# Patient Record
Sex: Female | Born: 1960 | Race: White | Hispanic: No | Smoking: Never smoker
Health system: Southern US, Community
[De-identification: ages and names within clinical notes are randomized; demographics above are authoritative.]

## PROBLEM LIST (undated history)

## (undated) DIAGNOSIS — N6002 Solitary cyst of left breast: Secondary | ICD-10-CM

## (undated) DIAGNOSIS — N6001 Solitary cyst of right breast: Secondary | ICD-10-CM

## (undated) HISTORY — PX: ANKLE FRACTURE SURGERY: SHX122

## (undated) HISTORY — PX: BREAST CYST ASPIRATION: SHX578

## (undated) HISTORY — PX: DENTAL SURGERY: SHX609

## (undated) HISTORY — PX: ABDOMINAL HYSTERECTOMY: SHX81

## (undated) HISTORY — PX: TUBAL LIGATION: SHX77

## (undated) HISTORY — PX: LEG SURGERY: SHX1003

## (undated) HISTORY — DX: Solitary cyst of right breast: N60.01

## (undated) HISTORY — DX: Solitary cyst of left breast: N60.02

---
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
## 1995-08-09 ENCOUNTER — Emergency Department: Admit: 1995-08-09 | Payer: Self-pay | Admitting: Emergency Medicine

## 1995-09-04 ENCOUNTER — Ambulatory Visit: Admit: 1995-09-04 | Disposition: A | Discharge status: Home / Self Care | Payer: Self-pay | Source: Ambulatory Visit

## 1998-12-09 DIAGNOSIS — I639 Cerebral infarction, unspecified: Secondary | ICD-10-CM

## 1998-12-09 HISTORY — DX: Cerebral infarction, unspecified: I63.9

## 1999-08-24 ENCOUNTER — Emergency Department: Admit: 1999-08-24 | Payer: Self-pay | Admitting: Emergency Medicine

## 2000-03-06 ENCOUNTER — Emergency Department: Admit: 2000-03-06 | Payer: Self-pay | Admitting: Emergency Medicine

## 2000-03-20 ENCOUNTER — Emergency Department: Admit: 2000-03-20 | Payer: Self-pay | Admitting: Emergency Medicine

## 2000-05-02 ENCOUNTER — Emergency Department: Admit: 2000-05-02 | Payer: Self-pay | Admitting: Emergency Medicine

## 2000-05-06 ENCOUNTER — Emergency Department: Admit: 2000-05-06 | Payer: Self-pay | Admitting: Emergency Medicine

## 2015-03-13 ENCOUNTER — Encounter (HOSPITAL_COMMUNITY): Payer: Self-pay

## 2015-03-13 ENCOUNTER — Emergency Department (HOSPITAL_COMMUNITY)
Admission: EM | Admit: 2015-03-13 | Discharge: 2015-03-13 | Disposition: A | Discharge status: Home / Self Care | Payer: Self-pay | Attending: Emergency Medicine | Admitting: Emergency Medicine

## 2015-03-13 ENCOUNTER — Emergency Department (HOSPITAL_COMMUNITY): Payer: Self-pay

## 2015-03-13 DIAGNOSIS — X58XXXA Exposure to other specified factors, initial encounter: Secondary | ICD-10-CM | POA: Insufficient documentation

## 2015-03-13 DIAGNOSIS — Y998 Other external cause status: Secondary | ICD-10-CM | POA: Insufficient documentation

## 2015-03-13 DIAGNOSIS — B372 Candidiasis of skin and nail: Secondary | ICD-10-CM

## 2015-03-13 DIAGNOSIS — R9389 Abnormal findings on diagnostic imaging of other specified body structures: Secondary | ICD-10-CM

## 2015-03-13 DIAGNOSIS — E669 Obesity, unspecified: Secondary | ICD-10-CM | POA: Insufficient documentation

## 2015-03-13 DIAGNOSIS — S80819A Abrasion, unspecified lower leg, initial encounter: Secondary | ICD-10-CM

## 2015-03-13 DIAGNOSIS — R079 Chest pain, unspecified: Secondary | ICD-10-CM

## 2015-03-13 DIAGNOSIS — R918 Other nonspecific abnormal finding of lung field: Secondary | ICD-10-CM | POA: Insufficient documentation

## 2015-03-13 DIAGNOSIS — Y9389 Activity, other specified: Secondary | ICD-10-CM | POA: Insufficient documentation

## 2015-03-13 DIAGNOSIS — L259 Unspecified contact dermatitis, unspecified cause: Secondary | ICD-10-CM | POA: Insufficient documentation

## 2015-03-13 DIAGNOSIS — Y9289 Other specified places as the place of occurrence of the external cause: Secondary | ICD-10-CM | POA: Insufficient documentation

## 2015-03-13 DIAGNOSIS — E041 Nontoxic single thyroid nodule: Secondary | ICD-10-CM

## 2015-03-13 LAB — URINALYSIS, ROUTINE W REFLEX MICROSCOPIC
Bilirubin Urine: NEGATIVE
GLUCOSE, UA: NEGATIVE mg/dL
HGB URINE DIPSTICK: NEGATIVE
KETONES UR: NEGATIVE mg/dL
Leukocytes, UA: NEGATIVE
Nitrite: NEGATIVE
PROTEIN: NEGATIVE mg/dL
Specific Gravity, Urine: 1.013 (ref 1.005–1.030)
Urobilinogen, UA: 0.2 mg/dL (ref 0.0–1.0)
pH: 5.5 (ref 5.0–8.0)

## 2015-03-13 LAB — I-STAT TROPONIN, ED: Troponin i, poc: 0 ng/mL (ref 0.00–0.08)

## 2015-03-13 LAB — BASIC METABOLIC PANEL
ANION GAP: 10 (ref 5–15)
BUN: 9 mg/dL (ref 6–23)
CALCIUM: 8.9 mg/dL (ref 8.4–10.5)
CO2: 27 mmol/L (ref 19–32)
Chloride: 103 mmol/L (ref 96–112)
Creatinine, Ser: 0.64 mg/dL (ref 0.50–1.10)
GLUCOSE: 99 mg/dL (ref 70–99)
POTASSIUM: 4.3 mmol/L (ref 3.5–5.1)
SODIUM: 140 mmol/L (ref 135–145)

## 2015-03-13 LAB — CBC WITH DIFFERENTIAL/PLATELET
BASOS ABS: 0 10*3/uL (ref 0.0–0.1)
Basophils Relative: 0 % (ref 0–1)
EOS ABS: 0.2 10*3/uL (ref 0.0–0.7)
EOS PCT: 2 % (ref 0–5)
HEMATOCRIT: 44.5 % (ref 36.0–46.0)
Hemoglobin: 14 g/dL (ref 12.0–15.0)
LYMPHS PCT: 34 % (ref 12–46)
Lymphs Abs: 3.3 10*3/uL (ref 0.7–4.0)
MCH: 31.9 pg (ref 26.0–34.0)
MCHC: 31.5 g/dL (ref 30.0–36.0)
MCV: 101.4 fL — AB (ref 78.0–100.0)
MONO ABS: 0.8 10*3/uL (ref 0.1–1.0)
Monocytes Relative: 9 % (ref 3–12)
Neutro Abs: 5.3 10*3/uL (ref 1.7–7.7)
Neutrophils Relative %: 55 % (ref 43–77)
Platelets: 214 10*3/uL (ref 150–400)
RBC: 4.39 MIL/uL (ref 3.87–5.11)
RDW: 12.9 % (ref 11.5–15.5)
WBC: 9.5 10*3/uL (ref 4.0–10.5)

## 2015-03-13 LAB — TSH: TSH: 1.29 u[IU]/mL (ref 0.350–4.500)

## 2015-03-13 LAB — D-DIMER, QUANTITATIVE: D-Dimer, Quant: 0.28 ug/mL-FEU (ref 0.00–0.48)

## 2015-03-13 MED ORDER — CEPHALEXIN 500 MG PO CAPS: 500.0000 mg | ORAL_CAPSULE | Freq: Two times a day (BID) | ORAL | Status: AC

## 2015-03-13 MED ORDER — ACETAMINOPHEN 325 MG PO TABS
650.0000 mg | ORAL_TABLET | Freq: Once | ORAL | Status: AC
  Administered 2015-03-13: 650 mg via ORAL
  Filled 2015-03-13: qty 2

## 2015-03-13 MED ORDER — BACITRACIN 500 UNIT/GM EX OINT
4.0000 "application " | TOPICAL_OINTMENT | Freq: Once | CUTANEOUS | Status: AC
  Administered 2015-03-13: 4 via TOPICAL
  Filled 2015-03-13: qty 3.6

## 2015-03-13 MED ORDER — SODIUM CHLORIDE 0.9 % IV BOLUS (SEPSIS)
1000.0000 mL | Freq: Once | INTRAVENOUS | Status: AC
  Administered 2015-03-13: 1000 mL via INTRAVENOUS

## 2015-03-13 MED ORDER — CLOTRIMAZOLE 1 % EX CREA: TOPICAL_CREAM | CUTANEOUS | Status: AC

## 2015-03-13 MED ORDER — IOHEXOL 300 MG/ML  SOLN
80.0000 mL | Freq: Once | INTRAMUSCULAR | Status: AC | PRN
  Administered 2015-03-13: 80 mL via INTRAVENOUS

## 2015-03-13 NOTE — ED Provider Notes (Signed)
CSN: [id number]     Arrival date & time 03/13/15  1313 History   First MD Initiated Contact with Patient 03/13/15 1507     Chief Complaint  Patient presents with  . Weakness  . Leg sores   . Nasal Congestion     (Consider location/radiation/quality/duration/timing/severity/associated sxs/prior Treatment) HPI   Pam Baxter is a 54 y.o. female brought in by EMS for evaluation of lower leg sores. Patient states that the sores popped up 2 weeks ago, she is scratching at them. She denies fevers, chills, spreading redness, streaking up the legs, pain, nausea vomiting, calf pain or leg swelling, history of DVT or PE. She states that she has a generalized weakness, states that she is lightheaded when going from sitting to standing. She has a left-sided chest pain which is intermittent, she cannot characterize the pain, she states that it comes in waves, it is 5 out of 10 at worst, she taken no pain medication. She does not take a daily aspirin. She states she feels slightly more short of breath and normal. She feels that she has a postnasal drip from a deviated septum associated with nasal congestion that is making her cough, sometimes the sputum is blood streaked. She also notes that she has a rash underneath the bilateral breasts which is pruritic.  History reviewed. No pertinent past medical history. Past Surgical History  Procedure Laterality Date  . Ankle fracture surgery    . Abdominal hysterectomy    . Tubal ligation    . Dental surgery    . Breast cyst aspiration     History reviewed. No pertinent family history. History  Substance Use Topics  . Smoking status: Never Smoker   . Smokeless tobacco: Not on file  . Alcohol Use: No   OB History    No data available     Review of Systems  10 systems reviewed and found to be negative, except as noted in the HPI.   Allergies  Review of patient's allergies indicates no known allergies.  Home Medications   Prior to Admission  medications   Medication Sig Start Date End Date Taking? Authorizing Provider  cephALEXin (KEFLEX) 500 MG capsule Take 1 capsule (500 mg total) by mouth 2 (two) times daily. 03/13/15   Jadynn Epping, PA-C  clotrimazole (LOTRIMIN) 1 % cream Apply to affected area 2 times daily 03/13/15   Joni Reining Ezra Marquess, PA-C   BP 132/99 mmHg  Pulse 88  Temp(Src) 98.2 F (36.8 C) (Oral)  Resp 18  SpO2 100% Physical Exam  Constitutional: She is oriented to person, place, and time.  Obese  HENT:  Poor dentition, posterior pharynx is injected, uvula is midline, soft palate rises symmetrically, no tonsillar hypertrophy or exudate, no tenderness to palpation of the maxillary or frontal sinuses. Mucosa in the nares is moderately edematous bilaterally.  Eyes: Conjunctivae are normal. Pupils are equal, round, and reactive to light.  Neck: Normal range of motion. Neck supple.  Cardiovascular: Normal rate, regular rhythm, normal heart sounds and intact distal pulses.   Pulmonary/Chest: Effort normal and breath sounds normal. No respiratory distress. She has no wheezes. She has no rales. She exhibits no tenderness.  Abdominal: Soft.  Neurological: She is alert and oriented to person, place, and time.  Skin: Rash noted.  Patient has mildly excoriated and bloody scabs to the bilateral lower extremities, there is no tenderness to palpation, warmth, induration, purulent discharge.   Patient has slightly raised, erythematous rash underneath the bilateral breasts, there is  no satellite lesions, no tenderness, warmth or discharge.    ED Course  Procedures (including critical care time) Labs Review Labs Reviewed  CBC WITH DIFFERENTIAL/PLATELET - Abnormal; Notable for the following:    MCV 101.4 (*)    All other components within normal limits  BASIC METABOLIC PANEL  URINALYSIS, ROUTINE W REFLEX MICROSCOPIC  D-DIMER, QUANTITATIVE  TSH  T4, FREE  I-STAT TROPOININ, ED    Imaging Review Dg Chest 2  View  03/13/2015   CLINICAL DATA:  Shortness of breath and left-sided chest pain  EXAM: CHEST  2 VIEW  COMPARISON:  None.  FINDINGS: Cardiac shadow is within normal limits. Rounded density is noted adjacent to the cardiac apex suspicious for an underlying mass lesion. CT of the chest is recommended for further evaluation. No focal infiltrate or sizable effusion is seen. Degenerative changes of the thoracic spine are noted.  IMPRESSION: Changes suspicious for left basilar mass. CT of the chest is recommended for further evaluation.   Electronically Signed   By: Alcide CleverMark  Lukens M.D.   On: 03/13/2015 16:58   Ct Chest W Contrast  03/13/2015   CLINICAL DATA:  Persistent cough.  Abnormal chest x-ray.  EXAM: CT CHEST WITH CONTRAST  TECHNIQUE: Multidetector CT imaging of the chest was performed during intravenous contrast administration.  CONTRAST:  80mL OMNIPAQUE IOHEXOL 300 MG/ML  SOLN  COMPARISON:  Chest radiograph of same day.  FINDINGS: No pneumothorax or pleural effusion is noted. No acute pulmonary disease is noted. No pulmonary nodule or mass is noted. There is no evidence of mediastinal mass or adenopathy. 2.2 cm nodule appears to be arising from the inferior portion of the left thyroid lobe; thyroid ultrasound is recommended for further evaluation. Thoracic aorta appears normal. Visualized portion of upper abdomen appears normal. No significant osseous abnormality is noted.  IMPRESSION: No evidence of pulmonary mass or nodule. No acute cardiopulmonary abnormality seen.  2.2 cm nodule arising from inferior portion of left thyroid lobe; thyroid ultrasound is recommended for further evaluation.   Electronically Signed   By: Lupita RaiderJames  Green Jr, M.D.   On: 03/13/2015 18:16     EKG Interpretation None      MDM   Final diagnoses:  Chest pain  Candidal dermatitis  Excoriation of lower leg, unspecified laterality, initial encounter  Abnormal CXR (chest x-ray)  Thyroid nodule    Filed Vitals:   03/13/15 1332  03/13/15 1634  BP: 133/86 132/99  Pulse: 89 88  Temp: 98 F (36.7 C) 98.2 F (36.8 C)  TempSrc: Oral Oral  Resp: 18 18  SpO2: 100% 100%    Medications  sodium chloride 0.9 % bolus 1,000 mL (1,000 mLs Intravenous New Bag/Given 03/13/15 1549)  bacitracin ointment 4 application (4 application Topical Given 03/13/15 1557)  acetaminophen (TYLENOL) tablet 650 mg (650 mg Oral Given 03/13/15 1610)  iohexol (OMNIPAQUE) 300 MG/ML solution 80 mL (80 mLs Intravenous Contrast Given 03/13/15 1744)    Johnette AbrahamCourtney Makepeace is a pleasant 54 y.o. female presenting with generalized weakness, sores to bilateral legs. He reports that she is coughing up blood as well. Physical exam is not consistent with DVT. Her dimer is negative. Chest x-ray shows possible mass.   CT shows no pulmonary mass or nodule however there is a 2.2 cm nodule arising from the inferior portion of the left thyroid. I will draw TSH and free T4, and explained to her that she will have to have this followed up as an outpatient. Patient will be given referral  to the wellness Center. I will start her on Keflex prophylactically for her lower extremity excoriations. Patient will be advised topical Lotrimin for her Candida underneath the bilateral breasts.  EKG is not crossing over into the AVM are. It shows normal sinus rhythm at 76 bpm with a left anterior fascicular block. Troponin is negative.  Evaluation does not show pathology that would require ongoing emergent intervention or inpatient treatment. Pt is hemodynamically stable and mentating appropriately. Discussed findings and plan with patient/guardian, who agrees with care plan. All questions answered. Return precautions discussed and outpatient follow up given.   New Prescriptions   CEPHALEXIN (KEFLEX) 500 MG CAPSULE    Take 1 capsule (500 mg total) by mouth 2 (two) times daily.   CLOTRIMAZOLE (LOTRIMIN) 1 % CREAM    Apply to affected area 2 times daily         Wynetta Emery,  PA-C 03/13/15 1908  Rolland Porter, MD 03/19/15 [phone]

## 2015-03-13 NOTE — Discharge Instructions (Signed)
Your workup today shows that you have a nodule on your thyroid. I have drawn some blood work today to further evaluate that, but he may need an ultrasound. Please follow with the wellness Center for follow-up on this blood work and to help you obtain your ultrasound.  Wash the affected area with soap and water and apply a thin layer of topical antibiotic ointment. Do this every 12 hours.   Do not use rubbing alcohol or hydrogen peroxide.                        Look for signs of infection: if you see redness, if the area becomes warm, if pain increases sharply, there is discharge (pus), if red streaks appear or you develop fever or vomiting, RETURN immediately to the Emergency Department  for a recheck.   Don't scratch at the areas on your legs. You can take Benadryl or apply topical hydrocortisone ointment when you feel the need to scratch.  Do not hesitate to return to the Emergency Department for any new, worsening or concerning symptoms.   If you do not have a primary care doctor you can establish one at the   [facility]: [address] [place] [address] [phone]  After you establish care. Let them know you were seen in the emergency room. They must obtain records for further management.

## 2015-03-13 NOTE — ED Notes (Signed)
Patient transported to CT 

## 2015-03-13 NOTE — ED Notes (Addendum)
Pt c/o increasing weakness, generalized pain, leg sores "just popping up," and nasal congestion x unknown amount of time.  Pain score 5/10.  Denies n/v/d.  Pt reports that she only came in because the facility manager called EMS.  [facility]

## 2015-03-13 NOTE — Progress Notes (Addendum)
EMS called to pt's apartment by house manager because pt does not leave her room. Pt c/o leg sores, cough, sinus problems, breast pain, armpit pain, hot flashes, and fatigue. Pt states she has not been to see a doctor since 2010. Pt [place]  Reports she is widow of a "cop" Confirms she has dental coverage not medical. [facility] [place] [phone] [address], [place], [place] [address]      CM spoke with pt who [place]cp. CM discussed and provided written information for self pay pcps, importance of pcp for f/u care, www.needymeds.org, www.goodrx.com, discounted pharmacies and other [place] resources such as [facility], [facility], affordable care act,  financial assistance, DSS and  health department  Reviewed resources for [place] self pay pcps like [facility], family medicine at [address], [facility] family [facility], general medical clinics, [facility] urgent care plus others, medication resources, [facility] out patient pharmacies and housing Pt voiced understanding and appreciation of resources provided   Provided [facility] contact information but pt is not eligible until she has been in [place] for 3-6 months   Provided pt with a 5 page list of [facility] providers within 15 miles from zip [phone]

## 2015-03-13 NOTE — ED Notes (Signed)
Per EMS, EMS called to pt's apartment by house manager because pt does not leave her room. Pt c/o leg sores, cough, sinus problems, breast pain, armpit pain, hot flashes, and fatigue. Pt states she has not been to see a doctor since 2010.

## 2015-03-14 LAB — T4, FREE: FREE T4: 1.1 ng/dL (ref 0.80–1.80)

## 2016-03-26 IMAGING — CR DG CHEST 2V
2 series · 2 of 2 positions shown · non-contrast
Comparison: None.

CLINICAL DATA: Shortness of breath and left-sided chest pain

EXAM:
CHEST  2 VIEW

[w chest pa]
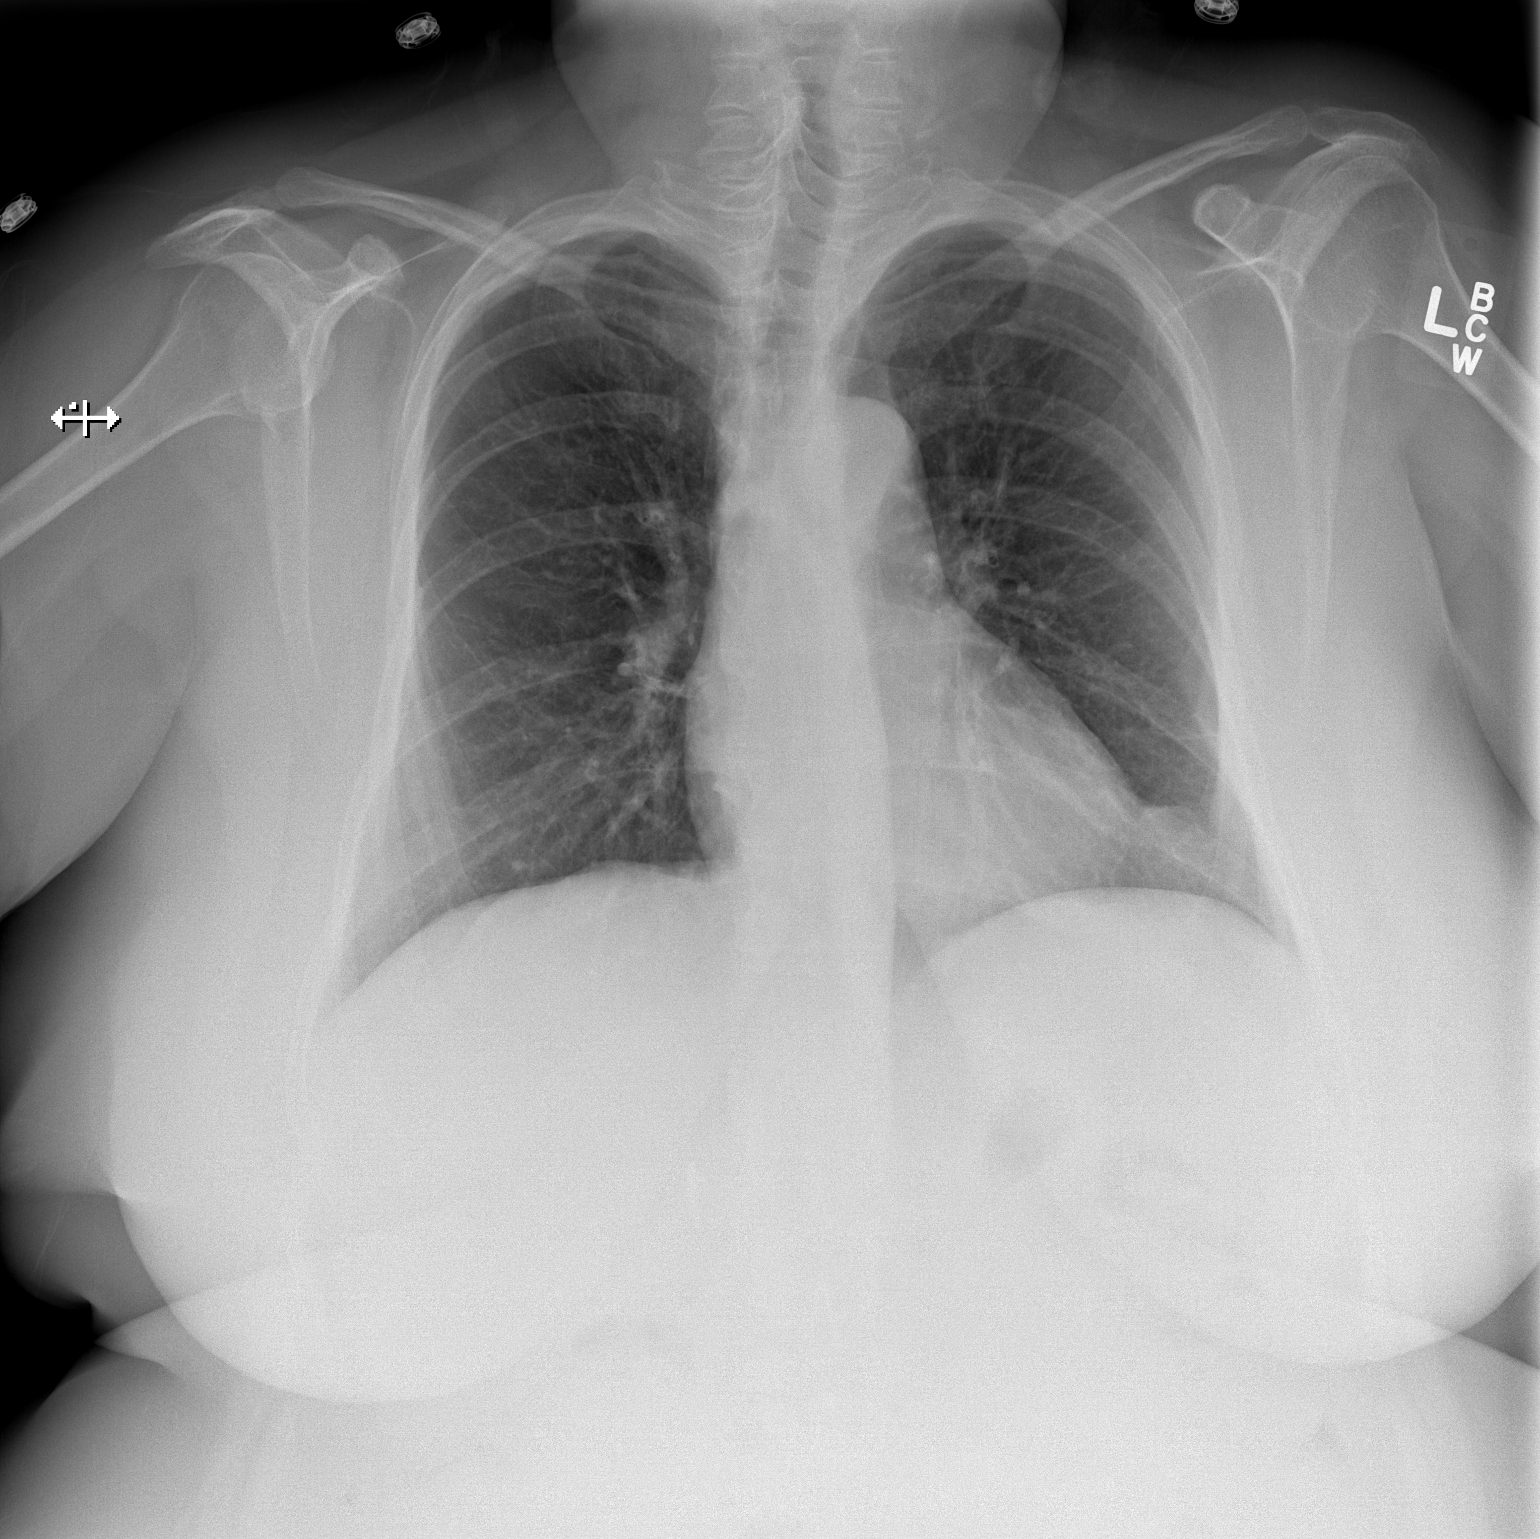

[w chest lat]
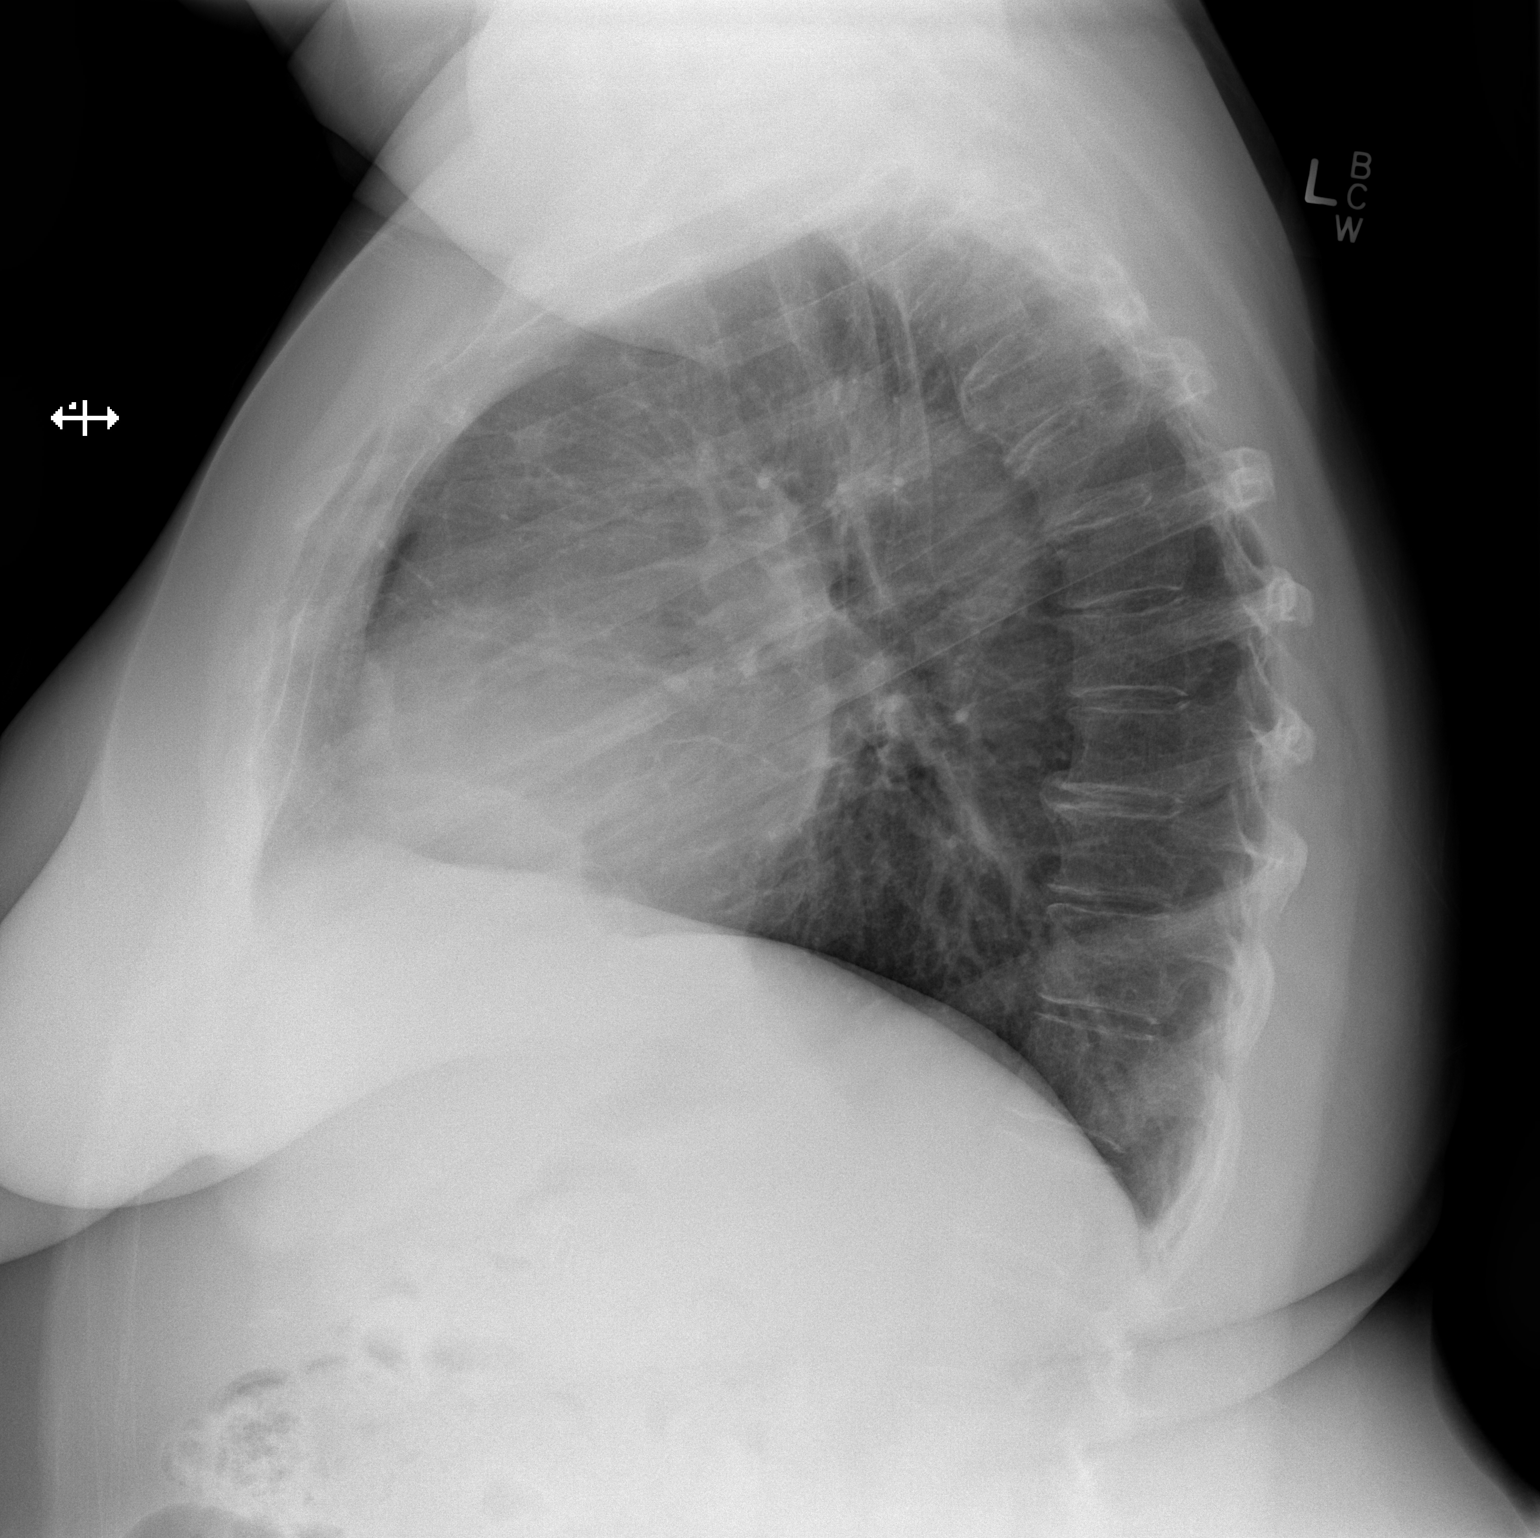

[2 of 2 positions shown; findings below may reference images not displayed]

FINDINGS: Cardiac shadow is within normal limits. Rounded density is noted
adjacent to the cardiac apex suspicious for an underlying mass
lesion. CT of the chest is recommended for further evaluation. No
focal infiltrate or sizable effusion is seen. Degenerative changes
of the thoracic spine are noted.
IMPRESSION: Changes suspicious for left basilar mass. CT of the chest is
recommended for further evaluation.

## 2016-03-26 IMAGING — CT CT CHEST W/ CM
1 series · 16 of 31 positions shown, 20 images · IV contrast (OMNIPAQUE 300)
Comparison: Chest radiograph of same day.

CLINICAL DATA: Persistent cough.  Abnormal chest x-ray.

EXAM:
CT CHEST WITH CONTRAST
TECHNIQUE: Multidetector CT imaging of the chest was performed during
intravenous contrast administration.
CONTRAST:  80mL OMNIPAQUE IOHEXOL 300 MG/ML  SOLN

[Series 2: chest with st · axial · 0.73mm/px · z∈[+1441,+1676]mm · 16 of 51 slices shown, 20 images]
[im 2/51  mediastinal]
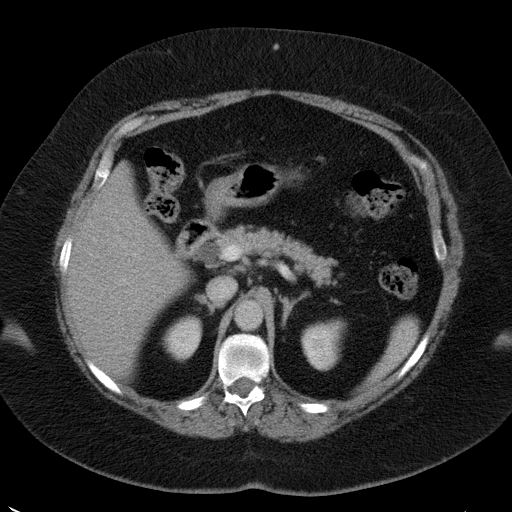
[im 2/51  lung]
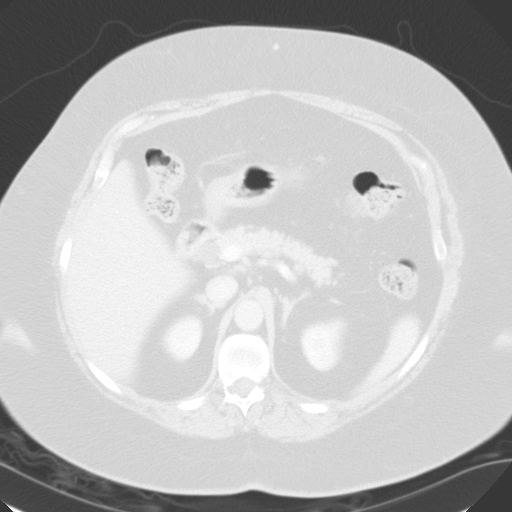
[im 6/51  lung]
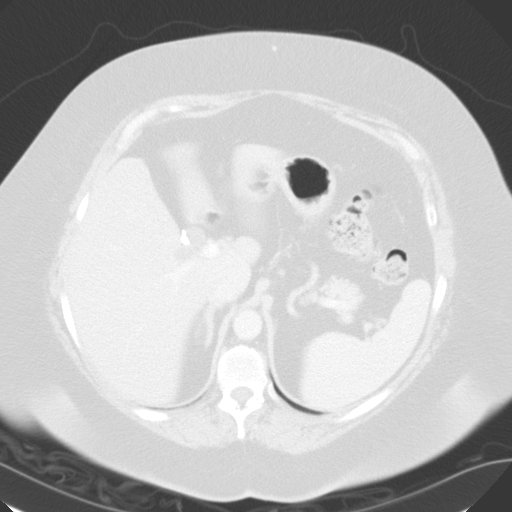
[im 10/51  lung]
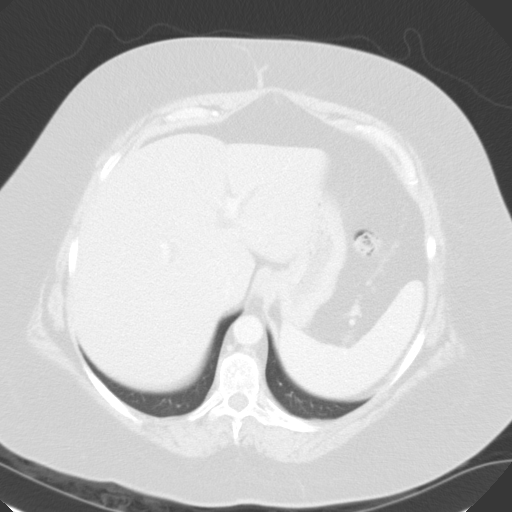
[im 12/51  lung]
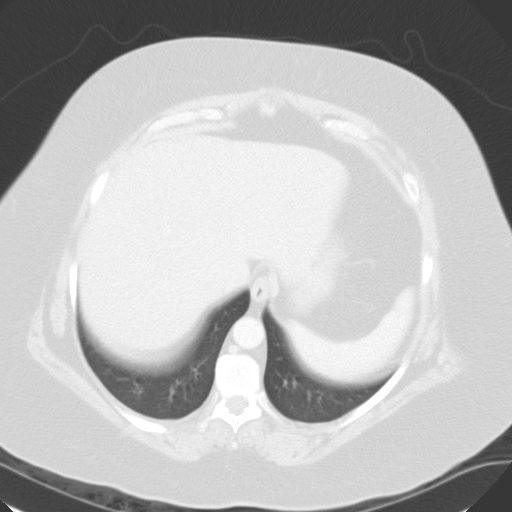
[im 15/51  mediastinal]
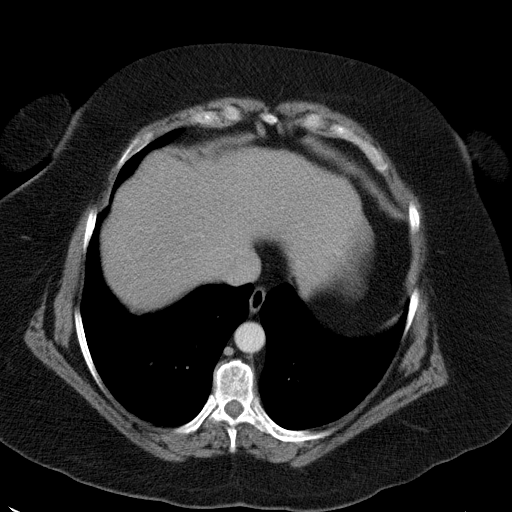
[im 15/51  lung]
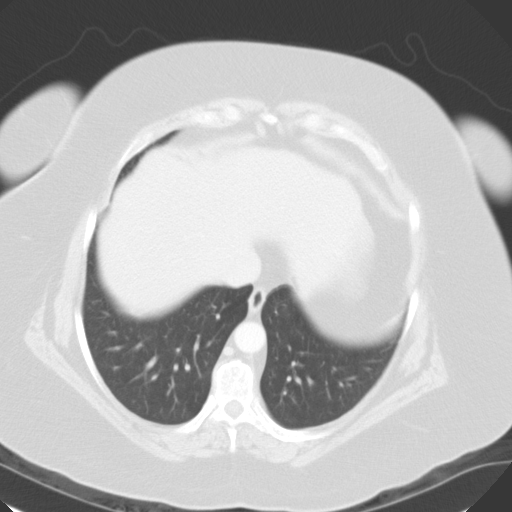
[im 17/51  lung]
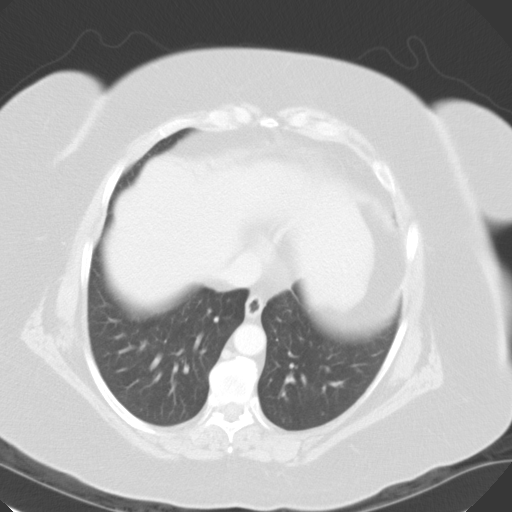
[im 21/51  lung]
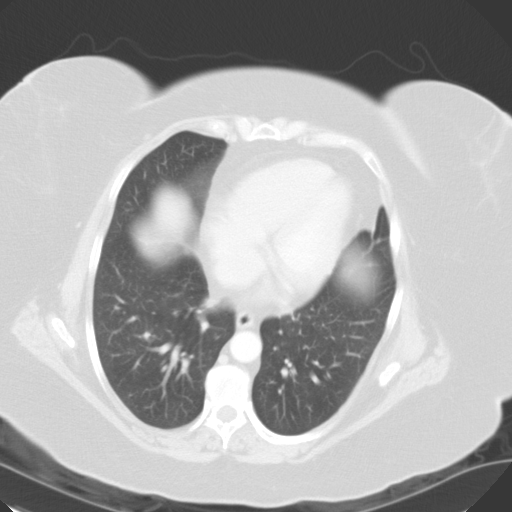
[im 24/51  lung]
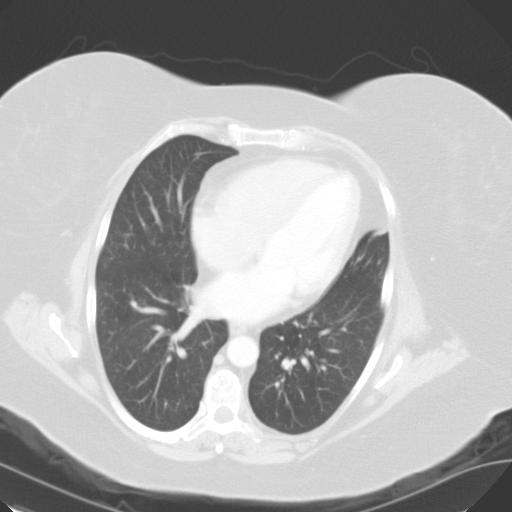
[im 26/51  mediastinal]
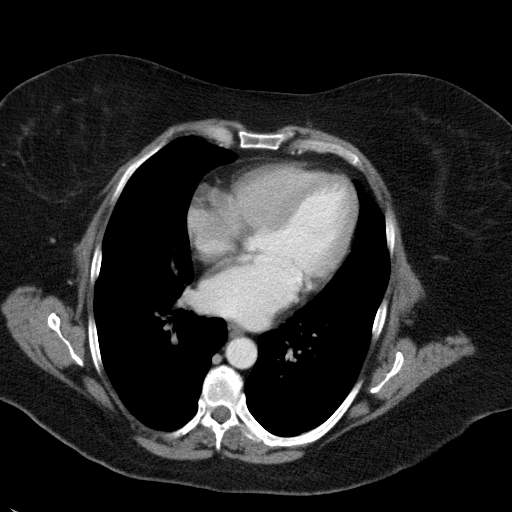
[im 26/51  lung]
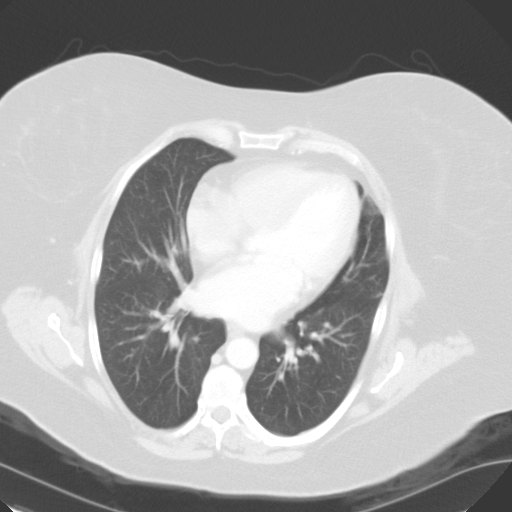
[im 30/51  lung]
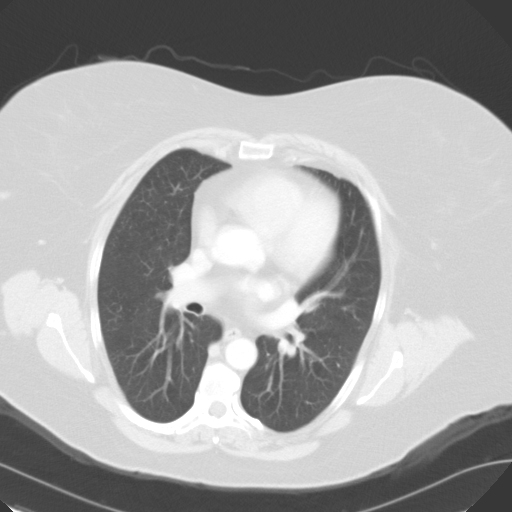
[im 32/51  lung]
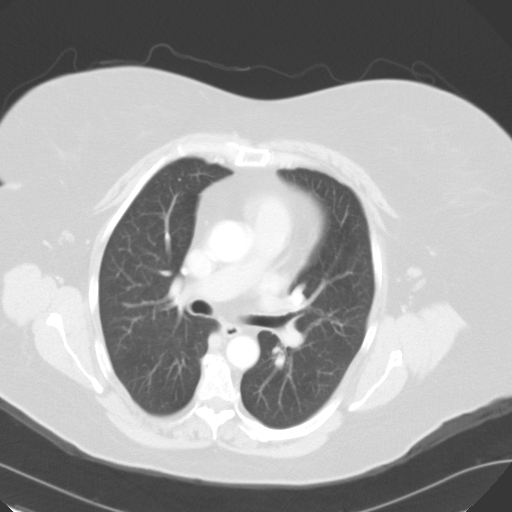
[im 34/51  lung]
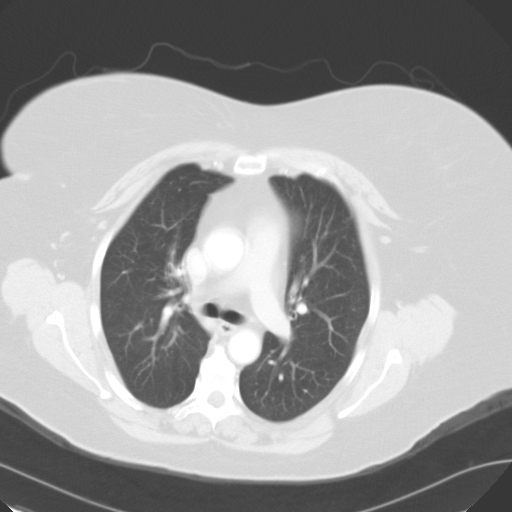
[im 38/51  mediastinal]
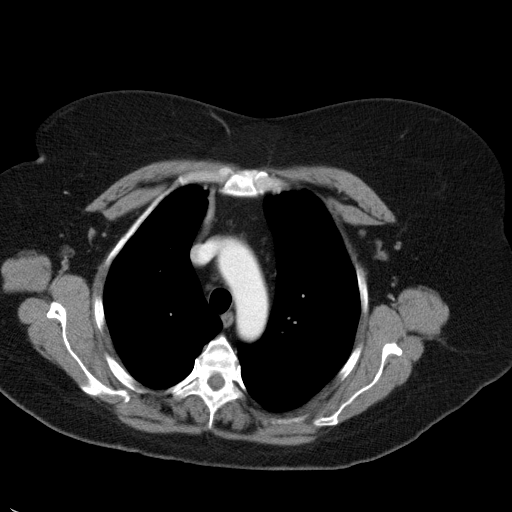
[im 38/51  lung]
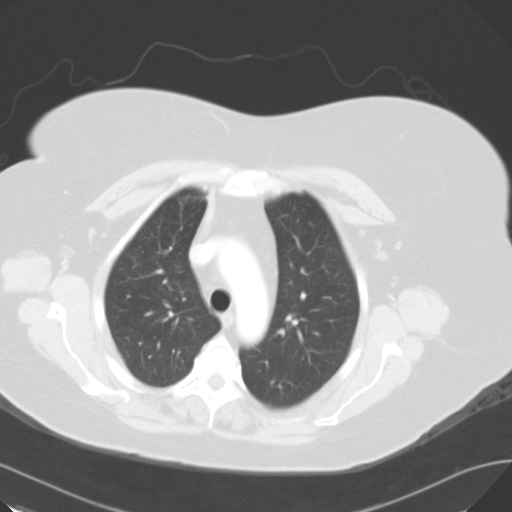
[im 41/51  lung]
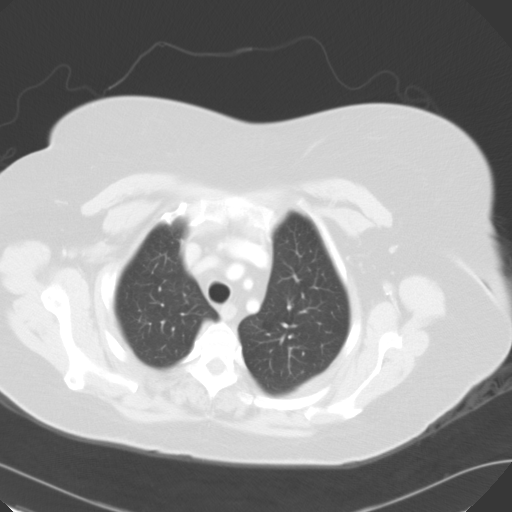
[im 45/51  lung]
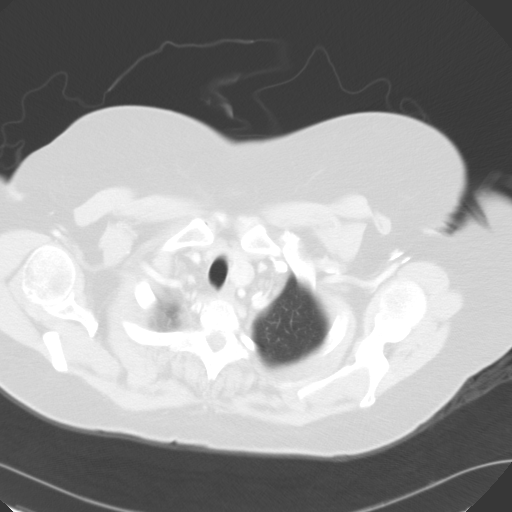
[im 49/51  lung]
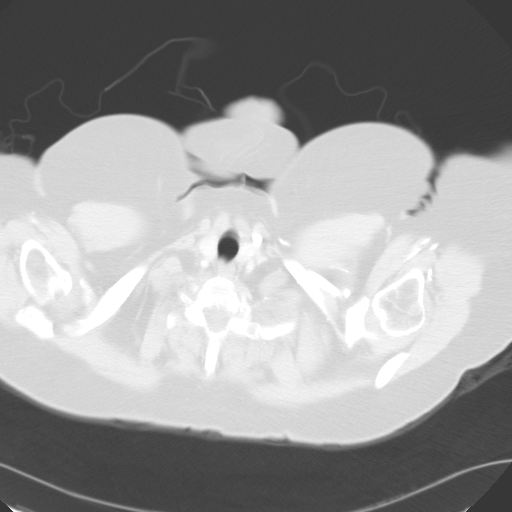

[16 of 31 positions shown; findings below may reference images not displayed]

FINDINGS: No pneumothorax or pleural effusion is noted. No acute pulmonary
disease is noted. No pulmonary nodule or mass is noted. There is no
evidence of mediastinal mass or adenopathy. 2.2 cm nodule appears to
be arising from the inferior portion of the left thyroid lobe;
thyroid ultrasound is recommended for further evaluation. Thoracic
aorta appears normal. Visualized portion of upper abdomen appears
normal. No significant osseous abnormality is noted.
IMPRESSION: No evidence of pulmonary mass or nodule. No acute cardiopulmonary
abnormality seen.

2.2 cm nodule arising from inferior portion of left thyroid lobe;
thyroid ultrasound is recommended for further evaluation.

## 2020-07-07 ENCOUNTER — Emergency Department: Payer: 344

## 2020-07-07 ENCOUNTER — Emergency Department
Admission: EM | Admit: 2020-07-07 | Discharge: 2020-07-07 | Disposition: A | Discharge status: Home / Self Care | Payer: 344 | Attending: Emergency Medicine | Admitting: Emergency Medicine

## 2020-07-07 DIAGNOSIS — E042 Nontoxic multinodular goiter: Secondary | ICD-10-CM | POA: Insufficient documentation

## 2020-07-07 DIAGNOSIS — K839 Disease of biliary tract, unspecified: Secondary | ICD-10-CM

## 2020-07-07 DIAGNOSIS — R140 Abdominal distension (gaseous): Secondary | ICD-10-CM | POA: Insufficient documentation

## 2020-07-07 DIAGNOSIS — E041 Nontoxic single thyroid nodule: Secondary | ICD-10-CM

## 2020-07-07 DIAGNOSIS — Z9049 Acquired absence of other specified parts of digestive tract: Secondary | ICD-10-CM | POA: Insufficient documentation

## 2020-07-07 DIAGNOSIS — K838 Other specified diseases of biliary tract: Secondary | ICD-10-CM | POA: Insufficient documentation

## 2020-07-07 DIAGNOSIS — R1084 Generalized abdominal pain: Secondary | ICD-10-CM | POA: Insufficient documentation

## 2020-07-07 DIAGNOSIS — R079 Chest pain, unspecified: Secondary | ICD-10-CM | POA: Insufficient documentation

## 2020-07-07 DIAGNOSIS — Z20822 Contact with and (suspected) exposure to covid-19: Secondary | ICD-10-CM | POA: Insufficient documentation

## 2020-07-07 DIAGNOSIS — R110 Nausea: Secondary | ICD-10-CM | POA: Insufficient documentation

## 2020-07-07 DIAGNOSIS — G479 Sleep disorder, unspecified: Secondary | ICD-10-CM | POA: Insufficient documentation

## 2020-07-07 LAB — CBC AND DIFFERENTIAL
Absolute NRBC: 0 10*3/uL (ref 0.00–0.00)
Basophils Absolute Automated: 0.06 10*3/uL (ref 0.00–0.08)
Basophils Automated: 0.7 %
Eosinophils Absolute Automated: 0.12 10*3/uL (ref 0.00–0.44)
Eosinophils Automated: 1.5 %
Hematocrit: 44.6 % — ABNORMAL HIGH (ref 34.7–43.7)
Hgb: 14.5 g/dL (ref 11.4–14.8)
Immature Granulocytes Absolute: 0.03 10*3/uL (ref 0.00–0.07)
Immature Granulocytes: 0.4 %
Lymphocytes Absolute Automated: 2.65 10*3/uL (ref 0.42–3.22)
Lymphocytes Automated: 32.8 %
MCH: 32.6 pg (ref 25.1–33.5)
MCHC: 32.5 g/dL (ref 31.5–35.8)
MCV: 100.2 fL — ABNORMAL HIGH (ref 78.0–96.0)
MPV: 10.1 fL (ref 8.9–12.5)
Monocytes Absolute Automated: 0.71 10*3/uL (ref 0.21–0.85)
Monocytes: 8.8 %
Neutrophils Absolute: 4.52 10*3/uL (ref 1.10–6.33)
Neutrophils: 55.8 %
Nucleated RBC: 0 /100 WBC (ref 0.0–0.0)
Platelets: 203 10*3/uL (ref 142–346)
RBC: 4.45 10*6/uL (ref 3.90–5.10)
RDW: 13 % (ref 11–15)
WBC: 8.09 10*3/uL (ref 3.10–9.50)

## 2020-07-07 LAB — COVID-19 (SARS-COV-2) & INFLUENZA  A/B, NAA (ROCHE LIAT)
Influenza A: NOT DETECTED
Influenza B: NOT DETECTED
SARS CoV 2 Overall Result: NOT DETECTED

## 2020-07-07 LAB — COMPREHENSIVE METABOLIC PANEL
ALT: 27 U/L (ref 0–55)
AST (SGOT): 30 U/L (ref 5–34)
Albumin/Globulin Ratio: 0.9 (ref 0.9–2.2)
Albumin: 3.6 g/dL (ref 3.5–5.0)
Alkaline Phosphatase: 101 U/L (ref 37–106)
Anion Gap: 12 (ref 5.0–15.0)
BUN: 16 mg/dL (ref 7–19)
Bilirubin, Total: 0.3 mg/dL (ref 0.2–1.2)
CO2: 24 mEq/L (ref 22–29)
Calcium: 9.6 mg/dL (ref 8.5–10.5)
Chloride: 106 mEq/L (ref 100–111)
Creatinine: 0.8 mg/dL (ref 0.6–1.0)
Globulin: 3.9 g/dL — ABNORMAL HIGH (ref 2.0–3.6)
Glucose: 133 mg/dL — ABNORMAL HIGH (ref 70–100)
Potassium: 4.9 mEq/L (ref 3.5–5.1)
Protein, Total: 7.5 g/dL (ref 6.0–8.3)
Sodium: 142 mEq/L (ref 136–145)

## 2020-07-07 LAB — URINALYSIS REFLEX TO MICROSCOPIC EXAM - REFLEX TO CULTURE
Bilirubin, UA: NEGATIVE
Glucose, UA: NEGATIVE
Ketones UA: NEGATIVE
Leukocyte Esterase, UA: NEGATIVE
Nitrite, UA: NEGATIVE
Protein, UR: 30 — AB
Specific Gravity UA: 1.026 (ref 1.001–1.035)
Urine pH: 5 (ref 5.0–8.0)
Urobilinogen, UA: 2 mg/dL (ref 0.2–2.0)

## 2020-07-07 LAB — TROPONIN I: Troponin I: <0.01 ng/mL (ref 0.00–0.05)

## 2020-07-07 LAB — RAPID DRUG SCREEN, URINE
Barbiturate Screen, UR: NEGATIVE
Benzodiazepine Screen, UR: NEGATIVE
Cannabinoid Screen, UR: NEGATIVE
Cocaine, UR: NEGATIVE
Opiate Screen, UR: NEGATIVE
PCP Screen, UR: NEGATIVE
Urine Amphetamine Screen: NEGATIVE

## 2020-07-07 LAB — GFR: EGFR: >60

## 2020-07-07 LAB — LIPASE: Lipase: 27 U/L (ref 8–78)

## 2020-07-07 LAB — TSH: TSH: 1.04 u[IU]/mL (ref 0.35–4.94)

## 2020-07-07 MED ORDER — GADOBUTROL 1 MMOL/ML IV SOLN
10.0000 mL | Freq: Once | INTRAVENOUS | Status: AC | PRN
Start: 2020-07-07 — End: 2020-07-07
  Administered 2020-07-07: 18:00:00 10 mmol via INTRAVENOUS

## 2020-07-07 MED ORDER — LORAZEPAM 2 MG/ML IJ SOLN
1.0000 mg | Freq: Once | INTRAMUSCULAR | Status: DC
Start: 2020-07-07 — End: 2020-07-07
  Filled 2020-07-07: qty 1

## 2020-07-07 MED ORDER — IOHEXOL 350 MG/ML IV SOLN
100.0000 mL | Freq: Once | INTRAVENOUS | Status: AC | PRN
Start: 2020-07-07 — End: 2020-07-07
  Administered 2020-07-07: 15:00:00 100 mL via INTRAVENOUS

## 2020-07-07 NOTE — ED Provider Notes (Signed)
EMERGENCY DEPARTMENT NOTE     Patient initially seen and examined at   ED PHYSICIAN ASSIGNED     None         ED MIDLEVEL (APP) ASSIGNED     None          HISTORY OF PRESENT ILLNESS   Historian:Patient  Translator Used: No    Chief Complaint: Chest Pain, Abdominal Pain, and Generalized weakness     Mechanism of Injury:       59 y.o. female presents to the ed today for diffuse and generalized symptoms. Pt is a poor historian and unsure what procedures she has had done in the past. She does know she has been told she has thyroid nodules, but takes no medications. Pt states she has been having difficulty sleeping, chest and abd pain generalized for the last several months. Pt c/o of intermittent nausea, chills, malaise, right shoulder pain.     1. Location of symptoms: diffuse  2. Onset of symptoms: at least 1 month  3. What was patient doing when symptoms started (Context): see above  4. Severity: moderate  5. Timing: intermittent  6. Activities that worsen symptoms: unknown  7. Activities that improve symptoms: unknown  8. Quality: nausea, generalized pain  9. Radiation of symptoms: no  10. Associated signs and Symptoms: see above  11. Are symptoms worsening? yes  MEDICAL HISTORY     Past Medical History:  No past medical history on file.    Past Surgical History:  No past surgical history on file.    Social History:  Social History     Socioeconomic History    Marital status: Widowed     Spouse name: Not on file    Number of children: Not on file    Years of education: Not on file    Highest education level: Not on file   Occupational History    Not on file   Tobacco Use    Smoking status: Not on file   Substance and Sexual Activity    Alcohol use: Not on file    Drug use: Not on file    Sexual activity: Not on file   Other Topics Concern    Not on file   Social History Narrative    Not on file     Social Determinants of Health     Financial Resource Strain:     Difficulty of Paying Living Expenses:     Food Insecurity:     Worried About Running Out of Food in the Last Year:     Ran Out of Food in the Last Year:    Transportation Needs:     Freight forwarder (Medical):     Lack of Transportation (Non-Medical):    Physical Activity:     Days of Exercise per Week:     Minutes of Exercise per Session:    Stress:     Feeling of Stress :    Social Connections:     Frequency of Communication with Friends and Family:     Frequency of Social Gatherings with Friends and Family:     Attends Religious Services:     Active Member of Clubs or Organizations:     Attends Banker Meetings:     Marital Status:    Intimate Partner Violence:     Fear of Current or Ex-Partner:     Emotionally Abused:     Physically Abused:     Sexually Abused:  Family History:  No family history on file.    Outpatient Medication:  Previous Medications    No medications on file         REVIEW OF SYSTEMS   Review of Systems   Constitutional: Positive for activity change, appetite change, chills and fatigue. Negative for diaphoresis and fever.   Respiratory: Negative for cough, chest tightness, shortness of breath and wheezing.    Cardiovascular: Positive for chest pain. Negative for palpitations.   Gastrointestinal: Positive for abdominal distention, abdominal pain and nausea. Negative for constipation, diarrhea and vomiting.   Genitourinary: Negative for dysuria.   Neurological: Negative for dizziness, seizures, weakness and headaches.   All other systems reviewed and are negative.             PHYSICAL EXAM     ED Triage Vitals [07/07/20 1206]   Enc Vitals Group      BP 114/82      Heart Rate 92      Resp Rate 22      Temp 99 F (37.2 C)      Temp Source Oral      SpO2 96 %      Weight 81.6 kg      Height 1.651 m      Head Circumference       Peak Flow       Pain Score 0      Pain Loc       Pain Edu?       Excl. in GC?      Physical Exam  Vitals and nursing note reviewed.   Constitutional:       Appearance:  She is well-developed. She is obese.   HENT:      Head: Normocephalic.   Eyes:      Pupils: Pupils are equal, round, and reactive to light.   Cardiovascular:      Rate and Rhythm: Normal rate and regular rhythm.      Heart sounds: Normal heart sounds.   Pulmonary:      Effort: Pulmonary effort is normal.      Breath sounds: Normal breath sounds.   Chest:      Chest wall: Tenderness present. No mass or edema.   Abdominal:      General: Bowel sounds are normal.      Palpations: Abdomen is soft.      Tenderness: There is abdominal tenderness.   Musculoskeletal:         General: Normal range of motion.      Cervical back: Normal range of motion.   Skin:     General: Skin is warm.      Capillary Refill: Capillary refill takes less than 2 seconds.      Coloration: Skin is pale.   Neurological:      General: No focal deficit present.      Mental Status: She is alert and oriented to person, place, and time.   Psychiatric:         Attention and Perception: She is inattentive.         Mood and Affect: Mood is anxious.         Behavior: Behavior is cooperative.         Thought Content: Thought content normal.         Cognition and Memory: Cognition normal.         Judgment: Judgment normal.           MEDICAL DECISION  MAKING     DISCUSSION    Patients symptoms not typical for emergent causes of abdominal pain such as, but not limited to, appendicitis, abdominal aortic aneurysm, surgical biliary disease, pancreatitis, SBO, mesenteric ischemia, serious intra-abdominal bacterial illness, genital torsion.  Doubt atypical ACS.    CBC to assess for anemia and infection.   CMP to assess for electrolyte abnormalities and kidney function  Lipase assess for pancreatitis   Trop r/o ischemia  UA r/o infection and assess for hematuria  EKG r/o STEMI or arrhythmia      Discussed pts findings for dilated bile duct on Ct with Dr. Roda Shutters (GI) who agrees that MRCP is recommenced. Will complete MRCP in ED to decided if admission vs transfer is  needed.     Pt has history of Thyroid nodules, TSH in normal limits pt can continue outpatient follow up for ultrasound of thyroid.     Pt currently in MRCP. Discussed pt with [facility] NP, who is aware of plan of care and will continue to follow pts MRCP results.           All labs and vital signs from the current visit have been reviewed and any abnormality that is present is not due to severe sepsis or septic shock.    Vital Signs: Reviewed the patients vital signs.   Nursing Notes: Reviewed and utilized available nursing notes.  Medical Records Reviewed: Reviewed available past medical records.  Counseling: The emergency provider has spoken with the patient and discussed todays findings, in addition to providing specific details for the plan of care.  Questions are answered and there is agreement with the plan.      MIPS DOCUMENTATION            CARDIAC STUDIES    The following cardiac studies were independently interpreted by the Emergency Medicine Physician.  For full cardiac study results please see chart.      EKG Interpretation:  Signed and interpreted byED NP  Time Interpreted: 1223  Comparison: na  Rate: 71  Rhythm: NSR  Axis: normal  Intervals: normal  Blocks:none  ST segments: no stemi  Interpretation: Nonspecific  EKG    EMERGENCY IMAGING STUDIES    The following imagine studies were independently interpreted by me (emergency physician):    Radiology:  Interpreted by me (ED NP)  Study: Chest Xray   Results: No infiltrate. No pneumothorax. No hemothorax. No cardiomegaly. No CHF.  Impression: No acute intrathoracic abnormality.    Radiology:      RADIOLOGY IMAGING STUDIES      CT Chest with Contrast   Final Result      1. Left inferior thyroid nodule measuring 2.6 cm. Ultrasound correlation   is recommended to determine if FNA is required.   2. No evidence of pulmonary embolus.   3. No lung infiltrate, pleural effusion, or pneumothorax.   4. Degenerative bone spurs in the mid thoracic spine.       Kinnie Feil, MD    07/07/2020 3:35 PM      CT Abd/Pelvis with IV Contrast   Final Result      1. The common duct is dilated to 13 mm and has a tapered appearance in   the region of the pancreas. Although this could be secondary to a   cholecystectomy, there is mild intrahepatic duct dilatation and a   tapered appearance to the distal common duct on coronal images. A distal   stricture or mass should be excluded. MRCP  would be the most useful   noninvasive way to evaluate this finding.   2. Scattered diverticuli. No bowel obstruction or inflammation.   3. Small hiatal hernia.      Kinnie Feil, MD    07/07/2020 3:42 PM      XR Chest  AP Portable   Final Result      Normal.      Adaline Sill, MD    07/07/2020 1:29 PM      MRI Abdomen W WO Contrast MRCP    (Results Pending)           PULSE OXIMETRY    Oxygen Saturation by Pulse Oximetry: 98%  Interventions: none  Interpretation:  WNL    EMERGENCY DEPT. MEDICATIONS      ED Medication Orders (From admission, onward)    Start Ordered     Status Ordering Provider    07/07/20 1711 07/07/20 1710  LORazepam (ATIVAN) injection 1 mg  Once     Route: Intravenous  Ordered Dose: 1 mg     Last MAR action: Not Given Geralynn Rile    07/07/20 1512 07/07/20 1512  iohexol (OMNIPAQUE) 350 MG/ML injection 100 mL  IMG once as needed     Route: Intravenous  Ordered Dose: 100 mL     Last MAR action: Imaging Agent Given [place], [place]          LABORATORY RESULTS    Ordered and independently interpreted AVAILABLE laboratory tests. Please see results section in chart for full details.  Results for orders placed or performed during the hospital encounter of 07/07/20   COVID-19 (SARS-COV-2) and Influenza ([place] [facility])    Specimen: Nasopharyngeal   Result Value Ref Range    Purpose of COVID testing Diagnostic -PUI     SARS-CoV-2 Specimen Source Nasopharyngeal     SARS CoV 2 Overall Result Not Detected     Influenza A Not Detected     Influenza B Not Detected    Comprehensive metabolic panel    Result Value Ref Range    Glucose 133 (H) 70 - 100 mg/dL    BUN 16 7 - 19 mg/dL    Creatinine 0.8 0.6 - 1.0 mg/dL    Sodium 161 096 - 045 mEq/L    Potassium 4.9 3.5 - 5.1 mEq/L    Chloride 106 100 - 111 mEq/L    CO2 24 22 - 29 mEq/L    Calcium 9.6 8.5 - 10.5 mg/dL    Protein, Total 7.5 6.0 - 8.3 g/dL    Albumin 3.6 3.5 - 5.0 g/dL    AST (SGOT) 30 5 - 34 U/L    ALT 27 0 - 55 U/L    Alkaline Phosphatase 101 37 - 106 U/L    Bilirubin, Total 0.3 0.2 - 1.2 mg/dL    Globulin 3.9 (H) 2.0 - 3.6 g/dL    Albumin/Globulin Ratio 0.9 0.9 - 2.2    Anion Gap 12.0 5 - 15   CBC and differential   Result Value Ref Range    WBC 8.09 3.1 - 9.5 x10 3/uL    Hgb 14.5 11.4 - 14.8 g/dL    Hematocrit 40.9 (H) 34.7 - 43.7 %    Platelets 203 142 - 346 x10 3/uL    RBC 4.45 3.90 - 5.10 x10 6/uL    MCV 100.2 (H) 78.0 - 96.0 fL    MCH 32.6 25.1 - 33.5 pg    MCHC 32.5 31 - 35 g/dL  RDW 13 11.0 - 15.0 %    MPV 10.1 8.9 - 12.5 fL    Neutrophils 55.8 None %    Lymphocytes Automated 32.8 None %    Monocytes 8.8 None %    Eosinophils Automated 1.5 None %    Basophils Automated 0.7 None %    Immature Granulocytes 0.4 None %    Nucleated RBC 0.0 0.0 - 0.0 /100 WBC    Neutrophils Absolute 4.52 1 - 6 x10 3/uL    Lymphocytes Absolute Automated 2.65 0.42 - 3.22 x10 3/uL    Monocytes Absolute Automated 0.71 0.21 - 0.85 x10 3/uL    Eosinophils Absolute Automated 0.12 0.00 - 0.44 x10 3/uL    Basophils Absolute Automated 0.06 0.00 - 0.08 x10 3/uL    Immature Granulocytes Absolute 0.03 0.00 - 0.07 x10 3/uL    Absolute NRBC 0.00 0.00 - 0.00 x10 3/uL   Lipase   Result Value Ref Range    Lipase 27 8 - 78 U/L   Troponin I   Result Value Ref Range    Troponin I <0.01 0.00 - 0.05 ng/mL   UA Reflex to Micro - Reflex to Culture   Result Value Ref Range    Urine Type Urine, Clean Ca     Color, UA Yellow Colorless - Yellow    Clarity, UA Sl Cloudy (A) Clear - Hazy    Specific Gravity UA 1.026 1.001 - 1.035    Urine pH 5.0 5.0 - 8.0    Leukocyte Esterase, UA Negative  Negative    Nitrite, UA Negative Negative    Protein, UR 30 (A) Negative    Glucose, UA Negative Negative    Ketones UA Negative Negative    Urobilinogen, UA 2.0 0.2 - 2.0 mg/dL    Bilirubin, UA Negative Negative    Blood, UA Small (A) Negative    RBC, UA 3 - 5 0 - 5 /hpf    WBC, UA 0 - 5 0 - 5 /hpf    Squamous Epithelial Cells, Urine 6 - 10 0 - 25 /hpf   GFR   Result Value Ref Range    EGFR >60.0    TSH   Result Value Ref Range    TSH 1.04 0.35 - 4.94 uIU/mL   Urine Rapid Drug Screen   Result Value Ref Range    Urine Amphetamine Screen Negative Negative    Barbiturate Screen, UR Negative Negative    Benzodiazepine Screen, UR Negative Negative    Cannabinoid Screen, UR Negative Negative    Cocaine, UR Negative Negative    Opiate Screen, UR Negative Negative    PCP Screen, UR Negative Negative   ECG 12 Lead   Result Value Ref Range    Ventricular Rate 71 BPM    Atrial Rate 71 BPM    P-R Interval 128 ms    QRS Duration 76 ms    Q-T Interval 374 ms    QTC Calculation (Bezet) 406 ms    P Axis 7 degrees    R Axis 12 degrees    T Axis 26 degrees       CRITICAL CARE/PROCEDURES    Procedures      DIAGNOSIS      Diagnosis:  Final diagnoses:   Dilated bile duct       Disposition:  ED Disposition     ED Disposition Condition Date/Time Comment    Observation  Fri Jul 07, 2020  5:29 PM  Prescriptions:  Patient's Medications    No medications on file        Geralynn Rile, NP  07/09/20 [phone]

## 2020-07-07 NOTE — ED Notes (Signed)
[facility] EM[facility] DEPARTMENT  ED NURSING NOTE FOR THE RECEIVING INPATIENT NURSE   ED NURSE Gavin Pound 1610   ED CHARGE RN Belenda Cruise   ADMISSION INFORMATION   [name] is a 59 y.o. female admitted with an ED diagnosis of:    1. Dilated bile duct    2. COVID-19 ruled out by laboratory testing         Isolation: None   Allergies: Patient has no known allergies.   Holding Orders confirmed? Yes   Belongings Documented? Yes   Home medications sent to pharmacy confirmed? N/A   NURSING CARE   Patient Comes From:   Mental Status: Home/Family Care  confused   ADL: Needs assistance with ADLs   Ambulation: 1 person assist   Pertinent Information  and Safety Concerns: n/a     CT / NIH   CT Head ordered on this patient?  N/A   NIH/Dysphagia assessment done prior to admission? N/A   VITAL SIGNS (at the time of this note)      Vitals:    07/07/20 1938   BP: 141/71   Pulse: 71   Resp: 17   Temp: 98 F (36.7 C)   SpO2: 97%

## 2020-07-07 NOTE — ED Provider Notes (Signed)
Received signed out from Kayren Eaves NP, pending MRCP results to discuss with Dr. Roda Shutters GI for admission vs [place]    @1705  Pt currently in MRI at this time.   @1930  Dr. Roda Shutters GI MD assessing patient at this time, Spoke with Dr. Roda Shutters who stated pt is clear for discharge on GI standpoint, can follow up with PCP.     @2003 -assessed patient patient sitting comfortable in stretcher saying "this is been a great day one of the best days" discussed what the GI doctor talked with her about, patient says that she understands to follow-up with primary care, does not have a primary care at this time.  Will give it neighborhood health information.  Also says that she used to see therapist will give IPAC information just for follow-up due to no insurance.  Patient states that she has a safe place to go home to. No SI/HI.  Patient safe and stable for discharge.  All questions answered.     Lewis Shock, NP  07/09/20 [phone]

## 2020-07-07 NOTE — Consults (Signed)
[address]  [place], [place] [address]  [phone]  Fax:[phone]    GASTROENTEROLOGY INITIAL CONSULTATION NOTE    Date Time: 07/07/20 7:10 PM  Patient Name: [name],[name]  Requesting Physician: No att. providers found       Reason for Consultation:   CBD dilatation  Abdominal pain    Assessment and Recommendations:   # Biliary dilatation.  Intrahepatic and extrahepatic biliary ductal dilatation.  With normal LFTs.  DDx: Postcholecystectomy change, rule out distal biliary stricture or papilla stenosis.  # Generalized abdominal pain.  This is part of her generalized symptoms including diffuse chest pain, abdominal pain, difficulty sleeping.  Lab tests and CT abdomen pelvis, MRCP were unrevealing for underlying structural or biochemical abnormality.    Recommendation:  -Continue supportive care and symptomatic treatment as you are doing.  -After discharge, follow-up with GI practice capable of performing endoscopic ultrasound to further evaluate for possible distal biliary stricture and ampullary stenosis.    History:   [name] is a 59 y.o. female who presents to the hospital on 07/07/2020 with generalized weakness, diffuse chest pain and abdominal pain for the last 1 month.  Endorsed insomnia, intermittent nausea, malaise.  Denied vomiting, diarrhea, constipation, seeing blood in stool, unintentional weight loss.  In the ER, she was afebrile and hemodynamically stable.  Lab tests including CBC and blood chemistries were unremarkable.  CT abdomen and pelvis showed posterior cholecystectomy status with intrahepatic and extrahepatic biliary ductal dilatation.  Subsequent MRI MRCP confirmed the biliary dilatation with CBD of 13 mm.  The pancreatic duct was normal.  There was no cholecystitis, cholelithiasis or choledocholithiasis.    Past Medical History:   No past medical history on file.    Past Surgical History:   No past surgical history on file.    Family History:   No  family history on file.    Social History:     Social History     Socioeconomic History    Marital status: Widowed     Spouse name: Not on file    Number of children: Not on file    Years of education: Not on file    Highest education level: Not on file   Occupational History    Not on file   Tobacco Use    Smoking status: Not on file   Substance and Sexual Activity    Alcohol use: Not on file    Drug use: Not on file    Sexual activity: Not on file   Other Topics Concern    Not on file   Social History Narrative    Not on file     Social Determinants of Health     Financial Resource Strain:     Difficulty of Paying Living Expenses:    Food Insecurity:     Worried About Running Out of Food in the Last Year:     Ran Out of Food in the Last Year:    Transportation Needs:     Freight forwarder (Medical):     Lack of Transportation (Non-Medical):    Physical Activity:     Days of Exercise per Week:     Minutes of Exercise per Session:    Stress:     Feeling of Stress :    Social Connections:     Frequency of Communication with Friends and Family:     Frequency of Social Gatherings with Friends and Family:     Attends  Religious Services:     Active Member of Clubs or Organizations:     Attends Banker Meetings:     Marital Status:    Intimate Partner Violence:     Fear of Current or Ex-Partner:     Emotionally Abused:     Physically Abused:     Sexually Abused:        Allergies:   No Known Allergies    Medications:       currently has no medications in their medication list.     Review of Systems:   Constitutional: Denies fever, chills or weight loss.  Eyes: Denies changes in vision.  ENMT: Denies changes in hearing, nasal discharge, oral lesions or sore throat.  Respiratory: Denies wheezing or cough.  Cardiovascular: Denies chest pain or palpitations.  Gastrointestinal: See HPI.  Genitourinary: Denies gross hematuria or dark urine.  Musculoskeletal: Denies back pain or  joint pain.  Neurologic: Denies slurred speech, hemiplegia or focal deficit.  Psychiatric: Denies depression or anxiety.   Hematologic: Denies easy bruising.  Integumentary:  Denies skin rashes or Jaundice.    Pertinent positives noted in HPI.    Physical Exam:     Vitals:    07/07/20 1828   BP: 141/63   Pulse: 71   Resp: 18   Temp: 98 F (36.7 C)   SpO2: 98%       General appearance - Well developed and well nourished, no acute distress.  Eyes - Sclera anicteric, no ptosis.  ENMT - Mucous membranes moist, nose and ears appear normal.  Oropharynx clear.  Respiratory - Non labored respirations, no audible wheezing.  Cardiovascular - Regular rate and rhythm, no JVD, no LE edema.  Gastrointestinal - Soft, non-tender, non-distended, no masses, normal bowel sounds.   Musculoskeletal - Normal range of motion of arms and legs.  Neurologic - Alert and oriented to person, place and time.  No gross movement disorders noted.  Psychiatric: Appropriate affect.  Skin: Normal color and turgor, no rashes, no suspicious skin lesions noted.      Labs Reviewed:     Recent Labs   Lab 07/07/20  1258   WBC 8.09   Hgb 14.5   Hematocrit 44.6*   Platelets 203       Recent Labs   Lab 07/07/20  1258   Sodium 142   Potassium 4.9   Chloride 106   CO2 24   BUN 16   Creatinine 0.8   Calcium 9.6   Albumin 3.6   Protein, Total 7.5   Bilirubin, Total 0.3   Alkaline Phosphatase 101   ALT 27   AST (SGOT) 30   Glucose 133*   Lipase 27               Rads:   GI Radiological Procedures since admission  reviewed.   Radiology Results (24 Hour)     Procedure Component Value Units Date/Time    MRI Abdomen W WO Contrast MRCP [[id number]] Collected: 07/07/20 1820    Order Status: Completed Updated: 07/07/20 1825    Narrative:      History: Biliary obstruction    Pre and postcontrast MRI of the abdomen with MRCP. 8 cc IV gadavist.    Mild hepatic steatosis. No hepatomegaly. Normal contour. No mass. Spleen  normal. Pancreas demonstrates trace mild fatty  infiltration but there is  no focal pancreatic masses seen in the pancreas enhances homogeneously  after contrast.    No adjacent adenopathy or aneurysm  is    Adrenal glands unremarkable.    Gallbladder resected. There is moderate diffuse dilation of the  extrahepatic dilated ductal tree to a maximum diameter of 13 mm with  mild to moderate intrahepatic or ductal dilatation. No focal biliary  masses are seen. There is relatively rapid tapering of the common bile  duct just proximal to the ampulla Vater.    Pancreatic duct is normal    No small bowel or colonic obstruction noted    Kidneys normal.    There are degenerative changes of the spine      Impression:       Nonspecific intrahepatic and extra hepatic biliary ductal  dilatation almost to the level of the ampulla Vater where there is rapid  tapering of the biliary tree. These findings are nonspecific but may  represent sequela of a distal biliary stricture or  papillary stenosis  although sometimes despite is simply seen as physiologic sequela of  cholecystectomy.    Laurena Slimmer, MD   07/07/2020 6:23 PM    CT Abd/Pelvis with IV Contrast [[id number]] Collected: 07/07/20 1537    Order Status: Completed Updated: 07/07/20 1544    Narrative:      INDICATION: Abdominal pain, acute and nonlocalized    COMPARISON: No prior study    TECHNIQUE:  Axial CT images were obtained for abdominal imaging from the  lung bases through the iliac crest and from the iliac crest to the  perineum for pelvic imaging. 100 cc's of Omnipaque 350 was utilized for  intravenous enhancement. Coronal and sagittal reconstruction and  reformatted images performed. A combination of automatic exposure  control and adjustment of the air may and/or KV was utilized according  to the patient size. The study was ordered without oral contrast.    INTERPRETATION:  Liver-unremarkable.  Gallbladder-surgically removed. Common duct measures 13 mm with mild  intrahepatic duct dilatation. Although this could be  postsurgical, the  distal common duct does have a tapered appearance on coronal  reconstruction images and a distal stricture or mass cannot be excluded.  MRCP would be the most useful way to evaluate this finding.  Pancreas-unremarkable.  Spleen-normal.  Adrenal glands-normal.  Retroperitoneum-normal caliber vessels. No retrocrural or  retroperitoneal adenopathy. Small hiatal hernia.  No iliac or significant inguinal adenopathy.  Kidneys-unremarkable.  Bladder-unremarkable.  Reproductive-unremarkable.  Bowel-no obstruction or inflammation. Normal appearance to the terminal  ileum. Appendix is not visualized but there is no right lower quadrant  inflammation. A few diverticuli are seen in the sigmoid and descending  colon which are small in size.  No ascites.  No free air.  Bone-degenerated disc at L4-5. Small bone spurs. No fracture or osseous  destruction.      Impression:        1. The common duct is dilated to 13 mm and has a tapered appearance in  the region of the pancreas. Although this could be secondary to a  cholecystectomy, there is mild intrahepatic duct dilatation and a  tapered appearance to the distal common duct on coronal images. A distal  stricture or mass should be excluded. MRCP would be the most useful  noninvasive way to evaluate this finding.  2. Scattered diverticuli. No bowel obstruction or inflammation.  3. Small hiatal hernia.    Kinnie Feil, MD   07/07/2020 3:42 PM    CT Chest with Contrast [[id number]] Collected: 07/07/20 1532    Order Status: Completed Updated: 07/07/20 1537    Narrative:  INDICATION: Cough and chest pain, generalized abdominal pain    TECHNIQUE:  Helical CT scan of the chest was obtained from the apices to  the lung bases after the uneventful administration of nonionic  intravenous  contrast. 100 cc Omnipaque 350 was utilized intravenously.  Coronal and sagittal reconstruction images performed. A combination of  automatic exposure control and adjustment of the MA  and/or according to  patient size was utilized.    FINDINGS:  Inferior thyroid gland-nodule in the left inferior lobe is cystic and  measures 7 mm. There is a larger nodule more inferiorly which is complex  and measures 2.6 cm. Ultrasound correlation is recommended based on CMS  criteria.  Axilla-nonenlarged lymph nodes.  Mediastinum-no mediastinal or hilar adenopathy. No pericardial effusion.  Aorta is normal in caliber. No filling defects in the pulmonary vessels  to indicate pulmonary embolus.  Lung fields-no pleural effusion or pneumothorax. No lung infiltrate or  mass. The airway is patent without wall thickening.  Bone-degenerative bone spurs in the mid thoracic spine.      Impression:        1. Left inferior thyroid nodule measuring 2.6 cm. Ultrasound correlation  is recommended to determine if FNA is required.  2. No evidence of pulmonary embolus.  3. No lung infiltrate, pleural effusion, or pneumothorax.  4. Degenerative bone spurs in the mid thoracic spine.    Kinnie Feil, MD   07/07/2020 3:35 PM    XR Chest  AP Portable [[id number]] Collected: 07/07/20 1329    Order Status: Completed Updated: 07/07/20 1332    Narrative:      Examination: Portable chest.    HISTORY: Pain unspecified.    COMPARISON: None.    FINDINGS:  Lungs, costophrenic angles clear.  Heart size normal.  Bones no acute process.      Impression:        Normal.    Adaline Sill, MD   07/07/2020 1:29 PM              Signed by: Einar Grad, MD

## 2020-07-07 NOTE — Discharge Instructions (Signed)
Follow up with [facility], call to make appt for follow up.  Call [facility] for mental health resources.  Return with new or worsening symptoms.         Abdominal Pain    You have been diagnosed with abdominal (belly) pain. The cause of your pain is not yet known.    Many things can cause abdominal pain such as infections and bowel (intestine) spasms. You might need another examination or more tests to find out why you have pain.    At this time, your pain does not seem to be caused by anything dangerous. You do not need surgery. You do not need to stay in the hospital.     Though we dont believe your condition is dangerous right now, it is important to be careful. Sometimes a problem that seems mild now can become serious later. If you do not get completely better or your symptoms get worse, you should seek more care. This is why it is important that you get additional help unless you are 100% improved   Follow up with your doctor.    For the next 24 hours, Drink only clear liquids such as:   Water.   Clear broth.   Sports drinks.   Clear caffeine-free soft drinks, like 7-Up or Sprite.    Return here or go to the nearest Emergency Department immediately if:   Your pain does not go away or gets worse.   You cannot keep fluids down    Your vomit (throw up) is dark green.    You vomit (throw up) blood or see blood in your stool (poop). Blood might be bright red or dark red. It can also be black and look like tar.   You have a fever (temperature higher than 100.35F / 38C) or shaking chills.   Your skin or eyes look yellow.   Your urine looks brown.   You have severe diarrhea.    If you can't follow up with your doctor, or if at any time you feel you need to be rechecked or seen again, come back here or go to the nearest emergency department.

## 2020-07-07 NOTE — ED Triage Notes (Signed)
Here for evaluation of abdominal issues and chest pains. Ongoing over time.

## 2020-07-09 LAB — ECG 12-LEAD
Atrial Rate: 71 {beats}/min
P Axis: 7 degrees
P-R Interval: 128 ms
Q-T Interval: 374 ms
QRS Duration: 76 ms
QTC Calculation (Bezet): 406 ms
R Axis: 12 degrees
T Axis: 26 degrees
Ventricular Rate: 71 {beats}/min

## 2020-07-17 ENCOUNTER — Observation Stay
Admission: EM | Admit: 2020-07-17 | Discharge: 2020-07-19 | Disposition: A | Discharge status: Other Acute Inpatient Hospital | Payer: 344 | Attending: Internal Medicine | Admitting: Internal Medicine

## 2020-07-17 ENCOUNTER — Telehealth (HOSPITAL_BASED_OUTPATIENT_CLINIC_OR_DEPARTMENT_OTHER): Payer: Self-pay

## 2020-07-17 DIAGNOSIS — Z20822 Contact with and (suspected) exposure to covid-19: Secondary | ICD-10-CM | POA: Insufficient documentation

## 2020-07-17 DIAGNOSIS — Z590 Homelessness: Secondary | ICD-10-CM | POA: Insufficient documentation

## 2020-07-17 DIAGNOSIS — R079 Chest pain, unspecified: Secondary | ICD-10-CM

## 2020-07-17 DIAGNOSIS — F39 Unspecified mood [affective] disorder: Secondary | ICD-10-CM | POA: Insufficient documentation

## 2020-07-17 DIAGNOSIS — M549 Dorsalgia, unspecified: Secondary | ICD-10-CM

## 2020-07-17 DIAGNOSIS — Z8673 Personal history of transient ischemic attack (TIA), and cerebral infarction without residual deficits: Secondary | ICD-10-CM | POA: Insufficient documentation

## 2020-07-17 DIAGNOSIS — F32A Depression, unspecified: Secondary | ICD-10-CM | POA: Diagnosis present

## 2020-07-17 DIAGNOSIS — F4322 Adjustment disorder with anxiety: Secondary | ICD-10-CM

## 2020-07-17 DIAGNOSIS — R45851 Suicidal ideations: Principal | ICD-10-CM | POA: Insufficient documentation

## 2020-07-17 DIAGNOSIS — F329 Major depressive disorder, single episode, unspecified: Secondary | ICD-10-CM

## 2020-07-17 LAB — COMPREHENSIVE METABOLIC PANEL
ALT: 22 U/L (ref 0–55)
AST (SGOT): 20 U/L (ref 5–34)
Albumin/Globulin Ratio: 1.3 (ref 0.9–2.2)
Albumin: 4 g/dL (ref 3.5–5.0)
Alkaline Phosphatase: 112 U/L — ABNORMAL HIGH (ref 37–106)
Anion Gap: 17 — ABNORMAL HIGH (ref 5.0–15.0)
BUN: 18 mg/dL (ref 7–19)
Bilirubin, Total: 0.5 mg/dL (ref 0.2–1.2)
CO2: 22 mEq/L (ref 22–29)
Calcium: 9.2 mg/dL (ref 8.5–10.5)
Chloride: 104 mEq/L (ref 100–111)
Creatinine: 0.8 mg/dL (ref 0.6–1.0)
Globulin: 3.2 g/dL (ref 2.0–3.6)
Glucose: 134 mg/dL — ABNORMAL HIGH (ref 70–100)
Potassium: 4.3 mEq/L (ref 3.5–5.1)
Protein, Total: 7.2 g/dL (ref 6.0–8.3)
Sodium: 143 mEq/L (ref 136–145)

## 2020-07-17 LAB — RAPID DRUG SCREEN, URINE
Barbiturate Screen, UR: NEGATIVE
Benzodiazepine Screen, UR: NEGATIVE
Cannabinoid Screen, UR: NEGATIVE
Cocaine, UR: NEGATIVE
Opiate Screen, UR: NEGATIVE
PCP Screen, UR: NEGATIVE
Urine Amphetamine Screen: NEGATIVE

## 2020-07-17 LAB — URINALYSIS REFLEX TO MICROSCOPIC EXAM - REFLEX TO CULTURE
Blood, UA: NEGATIVE
Glucose, UA: NEGATIVE
Ketones UA: NEGATIVE
Leukocyte Esterase, UA: NEGATIVE
Nitrite, UA: NEGATIVE
Protein, UR: 100 — AB
Specific Gravity UA: 1.03 (ref 1.001–1.035)
Urine pH: 5 (ref 5.0–8.0)
Urobilinogen, UA: 2 mg/dL (ref 0.2–2.0)

## 2020-07-17 LAB — CBC AND DIFFERENTIAL
Absolute NRBC: 0 10*3/uL (ref 0.00–0.00)
Basophils Absolute Automated: 0.06 10*3/uL (ref 0.00–0.08)
Basophils Automated: 0.5 %
Eosinophils Absolute Automated: 0.06 10*3/uL (ref 0.00–0.44)
Eosinophils Automated: 0.5 %
Hematocrit: 46.2 % — ABNORMAL HIGH (ref 34.7–43.7)
Hgb: 15 g/dL — ABNORMAL HIGH (ref 11.4–14.8)
Immature Granulocytes Absolute: 0.03 10*3/uL (ref 0.00–0.07)
Immature Granulocytes: 0.3 %
Lymphocytes Absolute Automated: 3.11 10*3/uL (ref 0.42–3.22)
Lymphocytes Automated: 27.8 %
MCH: 32.2 pg (ref 25.1–33.5)
MCHC: 32.5 g/dL (ref 31.5–35.8)
MCV: 99.1 fL — ABNORMAL HIGH (ref 78.0–96.0)
MPV: 10 fL (ref 8.9–12.5)
Monocytes Absolute Automated: 0.76 10*3/uL (ref 0.21–0.85)
Monocytes: 6.8 %
Neutrophils Absolute: 7.15 10*3/uL — ABNORMAL HIGH (ref 1.10–6.33)
Neutrophils: 64.1 %
Nucleated RBC: 0 /100 WBC (ref 0.0–0.0)
Platelets: 221 10*3/uL (ref 142–346)
RBC: 4.66 10*6/uL (ref 3.90–5.10)
RDW: 13 % (ref 11–15)
WBC: 11.17 10*3/uL — ABNORMAL HIGH (ref 3.10–9.50)

## 2020-07-17 LAB — COVID-19 (SARS-COV-2): SARS CoV 2 Overall Result: NEGATIVE

## 2020-07-17 LAB — ACETAMINOPHEN LEVEL: Acetaminophen Level: <7 ug/mL — ABNORMAL LOW (ref 10–30)

## 2020-07-17 LAB — GFR: EGFR: >60

## 2020-07-17 LAB — SALICYLATE LEVEL: Salicylate Level: <5 mg/dL — ABNORMAL LOW (ref 15.0–30.0)

## 2020-07-17 LAB — ETHANOL: Alcohol: NOT DETECTED mg/dL

## 2020-07-17 MED ORDER — SODIUM CHLORIDE 0.9 % IV BOLUS
1000.0000 mL | Freq: Once | INTRAVENOUS | Status: AC
Start: 2020-07-17 — End: 2020-07-17
  Administered 2020-07-17: 17:00:00 1000 mL via INTRAVENOUS
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
## 2020-07-17 NOTE — ED Notes (Signed)
Patient currently speaking with pysch liaison via telepsych cart.

## 2020-07-17 NOTE — H&P (Signed)
Cc:  Back pain, depression, homeless    HPI:  59 yo female with depression because she was evicted from her home earlier today and is currently homeless.  She is scared and felt that her life is falling apart and that she has nowhere to go.  All the shelters have closed and she is desperate.  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       Past Surgical History:     Past Surgical History:   Procedure Laterality Date    LEG SURGERY Left     Plate placed lower extremmity.       Family History:   History reviewed. No pertinent family history.    Social History:     Social History     Socioeconomic History    Marital status: Widowed     Spouse name: Not on file    Number of children: Not on file    Years of education: Not on file    Highest education level: Not on file   Occupational History    Not on file   Tobacco Use    Smoking status: Never Smoker    Smokeless tobacco: Never Used   Vaping Use    Vaping Use: Never used   Substance and Sexual Activity    Alcohol use: Yes     Comment: celebratory    Drug use: Never    Sexual activity: Not on file   Other Topics Concern    Not on file   Social History Narrative    Not on file     Social Determinants of Health     Financial Resource Strain:     Difficulty of Paying Living Expenses:    Food Insecurity:     Worried About Radiation protection practitioner of Food in the Last Year:     Barista in the Last Year:    Transportation Needs:     Freight forwarder (Medical):     Lack of Transportation (Non-Medical):    Physical Activity:     Days of Exercise per Week:     Minutes of Exercise per Session:     Stress:     Feeling of Stress :    Social Connections:     Frequency of Communication with Friends and Family:     Frequency of Social Gatherings with Friends and Family:     Attends Religious Services:     Active Member of Clubs or Organizations:     Attends Engineer, structural:     Marital Status:    Intimate Partner Violence:     Fear of Current or Ex-Partner:     Emotionally Abused:     Physically Abused:     Sexually Abused:      Review of Systems:   Review of Systems   Constitutional: Negative.    HENT: Negative.    Eyes: Negative.    Respiratory: Negative.    Cardiovascular: Negative.  Gastrointestinal: Negative.    Genitourinary: Negative.    Musculoskeletal: Positive for back pain.   Skin: Negative.    Neurological: Negative.    Endo/Heme/Allergies: Negative.    Psychiatric/Behavioral: Negative.        Physical Exam:   Physical Exam  Constitutional:       Appearance: Normal appearance.   HENT:      Head: Normocephalic and atraumatic.   Eyes:      Extraocular Movements: Extraocular movements intact.      Pupils: Pupils are equal, round, and reactive to light.   Cardiovascular:      Rate and Rhythm: Normal rate and regular rhythm.      Pulses: Normal pulses.      Heart sounds: Normal heart sounds.   Pulmonary:      Effort: Pulmonary effort is normal.      Breath sounds: Normal breath sounds.   Abdominal:      General: Abdomen is flat. There is no distension.      Palpations: Abdomen is soft. There is no mass.   Musculoskeletal:         General: Normal range of motion.      Cervical back: Normal range of motion and neck supple.   Skin:     General: Skin is warm.   Neurological:      General: No focal deficit present.      Mental Status: She is alert.   Psychiatric:         Mood and Affect: Mood normal.         Vitals:    07/17/20 2216   BP: 125/60   Pulse: 79   Resp: 16   Temp: 98.1 F (36.7 C)   SpO2: 97%       Labs:     Recent Labs   Lab 07/17/20  1716   WBC 11.17*   RBC 4.66   Hgb  15.0*   Hematocrit 46.2*   Platelets 221   Glucose 134*   BUN 18   Creatinine 0.8   Calcium 9.2   Sodium 143   Potassium 4.3   Chloride 104   CO2 22       Rads:   Radiological Procedure reviewed.      None    A/P:  This is a social admit because the patient has nowhere to go.  Will need to work with Child psychotherapist tomorrow to find her a shelter.

## 2020-07-17 NOTE — ED Notes (Signed)
[facility]  ED NURSING NOTE FOR THE RECEIVING INPATIENT NURSE   ED Willis, RN   [facility] [phone]   ED CHARGE RN Dorene Grebe, RN   ADMISSION INFORMATION   [name] is a 59 y.o. female admitted with an ED diagnosis of:    1. Depression, acute         Isolation: None   Allergies: Patient has no known allergies.   Holding Orders confirmed? Yes   Belongings Documented? Yes   Home medications sent to pharmacy confirmed? N/A   NURSING CARE   Patient Comes From:   Mental Status: Unknown  alert and oriented   ADL: Independent with all ADLs   Ambulation: no difficulty   Pertinent Information  and Safety Concerns: Patient recently evicted and being admitted for depression. Patient denies SI thoughts, just wanting help "getting back on her feet."      CT / NIH   CT Head ordered on this patient?  N/A   NIH/Dysphagia assessment done prior to admission? N/A   VITAL SIGNS (at the time of this note)      Vitals:    07/17/20 2216   BP: 125/60   Pulse: 79   Resp: 16   Temp: 98.1 F (36.7 C)   SpO2: 97%

## 2020-07-17 NOTE — ED Provider Notes (Signed)
Signed out by Trecia Rogers, FNP pending psych evaluation-    Patient has 1 L of fluid infusing.    @2125 -spoke with Maryanna Shape psych liaison, who said patient did not meet inpatient psychiatric treatment.  Will fax over homeless shelter information.  Patient was "begging me to keep her in the hospital for couple days because she is so scared"     @2200 -spoke with patient who is tearful, denies HI or SI, but says is scared to leave because "I have nowhere to go, I do not feel safe" patient was evicted from her home today.  Discussed potential to stay with friends or family, patient says she does not have either.     @2210  Spoke with Dr. Cyndie Chime who accepts obs admission for the patient with consult to social work and continual depression management.  No sitter needed.      Lewis Shock, NP  07/18/20 [phone]
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
## 2020-07-18 DIAGNOSIS — Z590 Homelessness: Secondary | ICD-10-CM

## 2020-07-18 LAB — ECG 12-LEAD
Atrial Rate: 73 {beats}/min
P Axis: 12 degrees
P-R Interval: 130 ms
Q-T Interval: 386 ms
QRS Duration: 74 ms
QTC Calculation (Bezet): 425 ms
R Axis: -7 degrees
T Axis: 15 degrees
Ventricular Rate: 73 {beats}/min

## 2020-07-18 MED ORDER — GLUCAGON 1 MG IJ SOLR (WRAP)
1.0000 mg | INTRAMUSCULAR | Status: DC | PRN
Start: 2020-07-18 — End: 2020-07-19

## 2020-07-18 MED ORDER — COVID-19 AD26 VACCINE(JANSSEN) 0.5 ML IM SUSP
0.5000 mL | INTRAMUSCULAR | Status: DC | PRN
Start: 2020-07-18 — End: 2020-07-19

## 2020-07-18 MED ORDER — DEXTROSE 50 % IV SOLN
12.5000 g | INTRAVENOUS | Status: DC | PRN
Start: 2020-07-18 — End: 2020-07-19

## 2020-07-18 MED ORDER — HEPARIN SODIUM (PORCINE) 5000 UNIT/ML IJ SOLN
5000.0000 [IU] | Freq: Three times a day (TID) | INTRAMUSCULAR | Status: DC
Start: 2020-07-18 — End: 2020-07-19
  Administered 2020-07-18 – 2020-07-19 (×6): 5000 [IU] via SUBCUTANEOUS
  Filled 2020-07-18 (×6): qty 1

## 2020-07-18 MED ORDER — MICONAZOLE NITRATE 2 % EX CREAM WITH ZINC OXIDE
TOPICAL_CREAM | Freq: Two times a day (BID) | CUTANEOUS | Status: DC
Start: 2020-07-18 — End: 2020-07-19
  Filled 2020-07-18: qty 92

## 2020-07-18 MED ORDER — ACETAMINOPHEN 325 MG PO TABS
650.0000 mg | ORAL_TABLET | Freq: Four times a day (QID) | ORAL | Status: DC | PRN
Start: 2020-07-18 — End: 2020-07-19

## 2020-07-18 MED ORDER — GLUCOSE 40 % PO GEL
15.0000 g | ORAL | Status: DC | PRN
Start: 2020-07-18 — End: 2020-07-19

## 2020-07-18 MED ORDER — IBUPROFEN 200 MG PO TABS
400.0000 mg | ORAL_TABLET | Freq: Four times a day (QID) | ORAL | Status: DC | PRN
Start: 2020-07-18 — End: 2020-07-19
  Administered 2020-07-18: 18:00:00 400 mg via ORAL
  Filled 2020-07-18: qty 2

## 2020-07-18 MED ORDER — NALOXONE HCL 0.4 MG/ML IJ SOLN (WRAP)
0.2000 mg | INTRAMUSCULAR | Status: DC | PRN
Start: 2020-07-18 — End: 2020-07-19

## 2020-07-18 MED ORDER — MELATONIN 3 MG PO TABS
3.0000 mg | ORAL_TABLET | Freq: Every evening | ORAL | Status: DC | PRN
Start: 2020-07-18 — End: 2020-07-19

## 2020-07-18 MED ORDER — MAGNESIUM HYDROXIDE 400 MG/5ML PO SUSP
30.0000 mL | Freq: Once | ORAL | Status: AC
Start: 2020-07-18 — End: 2020-07-18
  Administered 2020-07-18: 13:00:00 30 mL via ORAL
  Filled 2020-07-18: qty 30

## 2020-07-18 NOTE — Consults (Signed)
Wound Nurse Consultation Note:    Date Time: 08/10/215:12 PM  Patient Name:  [name]  Requesting Physician: Richarda Osmond, MD    Reason for Consultation:  ITD    History:  [name] is a 59 y.o. female admitted with Depression, acute.      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     PSH:   Past Surgical History:   Procedure Laterality Date    LEG SURGERY Left     Plate placed lower extremmity.     Height/Weight:  1.626 m (5\' 4" ) / 103.5 kg (228 lb 1.6 oz)   BMI: Body mass index is 39.15 kg/m.  Braden Score: Braden Scale Score: 20 (07/18/20 1020)      Significant Lab Values:  CBC:    WBC   Date Value Ref Range Status   07/17/2020 11.17 (H) 3.1 - 9.5 x10 3/uL Final     Hgb   Date Value Ref Range Status   07/17/2020 15.0 (H) 11.4 - 14.8 g/dL Final     Hematocrit   Date Value Ref Range Status   07/17/2020 46.2 (H) 34.7 - 43.7 % Final     MCV   Date Value Ref Range Status   07/17/2020 99.1 (H) 78.0 - 96.0 fL Final   :   A1C:  No results found for: HGBA1C     ALBUMIN:   Albumin   Date Value Ref Range Status   07/17/2020 4.0 3.5 - 5.0 g/dL Final           Wound Assessment/History:   Erythematous rash with satellite lesions, white exudate, and odor to skin folds of breasts, abdomen and groin. Also multiple skin tags indicative of chronic moisture.   Consistent with intertriginous dermatitis    Recommendations:   interdry to skin folds  secura to groing     Patient Education:  Treatment plan      Signed by: Corky Crafts MSN RN [facility]  Wound Ostomy Service  Spectralink/Office:  [phone]

## 2020-07-18 NOTE — Progress Notes (Signed)
Nutritional Support Services  Nutrition Assessment    [name] 59 y.o. female   MRN: [id number]    Summary of Nutrition Recommendations:     1. Continue regular diet.     2. Provide 1 Ensure Enlive PO BID to aid in meeting kcal/protein needs in setting of decreased intake x 2-3 months.    Each Ensure Enlive provides 350 kcal and 20 gm protein.    3. Monitor need for bowel meds.     -----------------------------------------------------------------------------------------------------------------                                                            Assessment Data:   Referral Source: RN Screen  Reason for Referral: MST 2 (unsure wt loss)     Nutrition: Patient was seen awake, alert at bedside. Per pt, her appetite is excellent. She has not been able to eat d/t financial circumstances. Pt is currently homeless and not eligible for food stamps. Pt reports not eating much of anything x 3-4 months as financial situation has escalated. Ate 100% of breakfast this morning. Pt expresses desire for Ensure. Denies N/V/D. Endorses constipation. Note LBM 8/10, pt not currently on bowel meds. Pt with unsure wt loss, unable to verify UBW. Pt does state she notices her clothes have gotten baggier. NFPE reveals mild to moderate signs of muscle/fat wasting.     Learning Needs: None at this time.     Hospital Admission: 59 yo female with depression, homelessness.     Medical Hx:  has a past medical history of Bilateral breast cysts (1980's), Cerebrovascular accident (2000), and Convulsions (1970).    PSH: has a past surgical history that includes Leg Surgery (Left).     Orders Placed This Encounter   Procedures   . Diet regular     Intake:  8/10: 100%     ANTHROPOMETRIC  Height: 162.6 cm (5\' 4" )  Weight: 103.5 kg (228 lb 1.6 oz)     Weight Change: 0  IBW/kg (Calculated) Female: 59.1 kg  IBW/kg (Calculated) Female: 54.54 kg        Body mass index is 39.15 kg/m.        Weight History Summary: No reliable wt hx per EMR. Will  continue to monitor trends inpatient. Pt endorses wt loss, however unsure of amount/time frame/UBW.   Weight Monitoring Height Method Weight Weight Method   07/07/2020 Stated 81.647 kg Stated   07/18/2020 Stated 103.465 kg Standing Scale     Physical Assessment: 8/10   Head: temple region: slight depression (moderate muscle loss), orbital region: slightly dark circles, somewhat hollow look (moderate fat loss) and buccal region: full cheeks, bounce-back noticeable (well nourished)  Upper Body: no overt s/s subcutaneous muscle or fat loss  Lower Body: no s/s subcutaneous fat or muscle loss  Edema: RLE/LLE non pitting edema   Skin: MASD breast/abdomen/groin  GI function: WDL     ESTIMATED NEEDS    Total Daily Energy Needs: 1552.5 to 2070 kcal  Method for Calculating Energy Needs: 15 kcal - 20 kcal per kg  at 103.5 kg (Actual body weight)  Rationale: noncritical, obese BMI       Total Daily Protein Needs: 81.81 to 109.08 g  Method for Calculating Protein Needs: 1.5 g - 2 g per kg at  54.54 kg (Ideal body weight)  Rationale: noncritical, obese BMI      Total Daily Fluid Needs: 1472.58 to 1745.28 ml  Method for Calculating Fluid Needs: 27 ml - 32 ml  per kg at 54.54 kg (Ideal body weight)  Rationale: age 64, obese BMI - or per MD      Pertinent Medications: reviewed, unremarkable     IVF:      No Known Allergies      Pertinent labs: Glucose: 134                                                                Nutrition Diagnosis      Inadequate oral intake related to homelessness as evidenced by pt reports of little to no PO intake (besides water) for 3 months. New                                                                 Intervention     Nutrition recommendation - Please refer to top of note                                                               Monitoring/Evaluation     Goals:     1. Patient will be meeting >/= 75% nutritional needs through diet and supplements by next RD f/u. New         Nutrition Risk Level:  Moderate (will follow up at least 1 time per week and PRN)       [place] Marciana Uplinger  Clinical Dietitian  Ext. [phone]

## 2020-07-18 NOTE — Progress Notes (Addendum)
CM met with patient, informed CM that she is homeless  And would like to stay @ the hospital for a few days  (" to help me sort out myself"). CM informed her will reach out shelter placement for  Her.Explained to her hospital will not keep her when she is medically stable.    1257  CM called [facility], was informed center filled no vacancy. CM then called [place] [facility] and was advised to call department of health.CM called the department of health @ [phone], spoke with Clydie Braun. Patient to go the department upon discharge- should bring IF as prof of residence in [place].  Address; [address] [place] [address].  CM gave information to patient and updated bedside RN

## 2020-07-18 NOTE — UM Notes (Addendum)
Admit to Observation (Order [id number])  Admission  Date: 07/17/2020 Department: 3B Oncology General Medicine Ordering/Authorizing: Lewis Shock, NP   Order Information  Order Date/Time   07/17/20 10:10 PM     Diagnosis Level of Care Patient Class   Depression, Acute Acute Observation     HPI:  "59 yo female with depression because she was evicted from her home earlier today and is currently homeless.  She is scared and felt that her life is falling apart and that she has nowhere to go.  All the shelters have closed and she is desperate.  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         07/17/20 2216   BP: 125/60   Pulse: 79   Resp: 16   Temp: 98.1 F (36.7 C)   SpO2: 97%        Recent Labs   Lab 07/17/20  1716   WBC 11.17*   RBC 4.66   Hgb 15.0*   Hematocrit 46.2*   Platelets 221   Glucose 134*      UA  Component   Ref Range & Units 1 d ago   Urine Type  Urine, Clean Ca    Color, UA   Colorless - Yellow Yellow    Clarity, UA   Clear - Hazy TurbidAbnormal     Protein, UR   Negative 100Abnormal     Bilirubin, UA   Negative ModerateAbnormal     Yeast, UA   None ManyAbnormal             EKG: NSR     Date/Time Order Dose Route    07/17/2020 1725 sodium chloride 0.9 % bolus 1,000 mL 1,000 mL Intravenous       A/P:  This is a social admit because the patient has nowhere to go.  Will  need to work with Child psychotherapist tomorrow to find her a shelter.    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  She denies SI/HI/SIB and  AVH.   Pt did not present as a danger to self or others and therefore did not require a contract for safety.  TW provided pt with a list of shelters and [facility] information.

## 2020-07-18 NOTE — Final Progress Note (DC Note for stay less than 48 (Addendum)
Date:07/18/2020   Patient Name: [name]  Attending Physician: Richarda Osmond, MD  Today:   BP 98/47    Pulse 76    Temp 98.8 F (37.1 C) (Oral)    Resp 18    Ht 1.626 m (5\' 4" )    Wt 103.5 kg (228 lb 1.6 oz)    SpO2 94%    BMI 39.15 kg/m   Ranges for the last 24 hours:  Temp:  [97.5 F (36.4 C)-98.8 F (37.1 C)] 98.8 F (37.1 C)  Heart Rate:  [63-79] 76  Resp Rate:  [16-18] 18  BP: (98-129)/(47-73) 98/47    Date of Admission:   07/17/2020    Date of Discharge:   07/18/2020,was discharged to 3A psych on 8/11 due to suicidal ideation    Outcome of Hospitalization:   -Homelessness  -Adjustment disorder/depression,was not suicidal initially but then became suicidal likely malingering due to homelessness   -Chronic back pain  -Left inferior thyroid nodule 2.6 cm on previous imaging with normal TSH  -intrahepatic and extra hepatic biliary ductal dilatation almost to the level of the ampulla Vater likely sequela of cholecystectomy  -morbid obesity based on Body mass index is 39.15 kg/m.      Significant Events:       59 yo female with h/o depression who was evicted from her home yesterday and is currently homeless.she is scared and feels that her life is falling apart and has nowhere to go. Psych did an evaluation in ED and did not feel she met admission for psych issues since she was not suicidal and had no hallucinations or psychosis. She is medically stable and this is social admission, case manager on board and she needs to go to a shelter. She has chronic back pain and does not need inpatient work up as she has no alarming features and her thyroid nodule that was discovered on CT chest done on 07/07/2020 with normal TSH could be work up as out pt and does not need in patient work. Will consult psych to make sure she does not need any medical treatment for her depression and will also get PT to work with her before discharge.     She has not been vaccinated for COVID-19 and agreed to COVID-19 vaccination  during this hospitalization, J&J vaccine is ordered to be given to her before discharge.    Case was discussed with pt and RN and case Production designer, theatre/television/film.    Exam:  General:AAOx3,no acute distress  Cardiac:RRR  Lungs:CTA b/l  Neuro:AAOX3,moves all her extremities  Legs:no swelling    Addendum:  -pt became suicidal this am, on suicidal precaution, psych consult done and she is being admitted to 3A.she is also seen by PT and did well.  Psych unit is requesting COVID-19 test before transfer, she also agreed to COVID-19 vaccine which is ordered to be given before her transfer to psych unit.    Lab Results last 48 Hours     Procedure Component Value Units Date/Time    COVID-19 (SARS-COV-2) [facility] Rapid) [[id number]] Collected: 07/17/20 2216    Specimen: Nasopharyngeal Swab from Nasopharynx Updated: 07/17/20 2302     Purpose of COVID testing Screening     SARS-CoV-2 Specimen Source Nasopharyngeal     SARS CoV 2 Overall Result Negative    Narrative:      o Collect and clearly label specimen type:  o Upper respiratory specimen: One Nasopharyngeal Dry Swab NO  Transport Media.  o Hand deliver to laboratory ASAP  Indication for testing->Extended care facility admission to  semi private room  Screening    UA Reflex to Micro - Reflex to Culture [[id number]]  (Abnormal) Collected: 07/17/20 1716     Updated: 07/17/20 1802     Urine Type Urine, Clean Ca     Color, UA Yellow     Clarity, UA Turbid     Specific Gravity UA 1.030     Urine pH 5.0     Leukocyte Esterase, UA Negative     Nitrite, UA Negative     Protein, UR 100     Glucose, UA Negative     Ketones UA Negative     Urobilinogen, UA 2.0 mg/dL      Bilirubin, UA Moderate     Blood, UA Negative     Squamous Epithelial Cells, Urine 11 - 25 /hpf      Yeast, UA Many     Urine Mucus Present    Ethanol (Alcohol) Level [[id number]] Collected: 07/17/20 1716    Specimen: Blood Updated: 07/17/20 1750     Alcohol None Detected mg/dL     GFR [[id number]] Collected: 07/17/20 1716     Updated: 07/17/20  1750     EGFR >60.0    Comprehensive metabolic panel [[id number]]  (Abnormal) Collected: 07/17/20 1716    Specimen: Blood Updated: 07/17/20 1750     Glucose 134 mg/dL      BUN 18 mg/dL      Creatinine 0.8 mg/dL      Sodium 629 mEq/L      Potassium 4.3 mEq/L      Chloride 104 mEq/L      CO2 22 mEq/L      Calcium 9.2 mg/dL      Protein, Total 7.2 g/dL      Albumin 4.0 g/dL      AST (SGOT) 20 U/L      ALT 22 U/L      Alkaline Phosphatase 112 U/L      Bilirubin, Total 0.5 mg/dL      Globulin 3.2 g/dL      Albumin/Globulin Ratio 1.3     Anion Gap 17.0    Acetaminophen level [[id number]]  (Abnormal) Collected: 07/17/20 1716    Specimen: Blood Updated: 07/17/20 1750     Acetaminophen Level <7 ug/mL     Salicylate level [[id number]]  (Abnormal) Collected: 07/17/20 1716    Specimen: Blood Updated: 07/17/20 1750     Salicylate Level <5.0 mg/dL     Urine Tox Screen (Rapid Drug Screen) [[id number]] Collected: 07/17/20 1716    Specimen: Urine Updated: 07/17/20 1748     Urine Amphetamine Screen Negative     Barbiturate Screen, UR Negative     Benzodiazepine Screen, UR Negative     Cannabinoid Screen, UR Negative     Cocaine, UR Negative     Opiate Screen, UR Negative     PCP Screen, UR Negative    CBC and differential [[id number]]  (Abnormal) Collected: 07/17/20 1716    Specimen: Blood Updated: 07/17/20 1728     WBC 11.17 x10 3/uL      Hgb 15.0 g/dL      Hematocrit 43.3 %      Platelets 221 x10 3/uL      RBC 4.66 x10 6/uL      MCV 99.1 fL      MCH 32.2 pg      MCHC 32.5 g/dL      RDW 13 %      MPV  10.0 fL      Neutrophils 64.1 %      Lymphocytes Automated 27.8 %      Monocytes 6.8 %      Eosinophils Automated 0.5 %      Basophils Automated 0.5 %      Immature Granulocytes 0.3 %      Nucleated RBC 0.0 /100 WBC      Neutrophils Absolute 7.15 x10 3/uL      Lymphocytes Absolute Automated 3.11 x10 3/uL      Monocytes Absolute Automated 0.76 x10 3/uL      Eosinophils Absolute Automated 0.06 x10 3/uL      Basophils Absolute Automated 0.06  x10 3/uL      Immature Granulocytes Absolute 0.03 x10 3/uL      Absolute NRBC 0.00 x10 3/uL           Procedures performed:   No results found.      Treatment Team:   Attending Provider: Richarda Osmond, MD  Consulting Physician: Edwyna Shell, MD    Disposition:   Disposition: psych unit 3A    Condition at Discharge:   stable     Discharge Instructions:     Follow-up Information     [facility] Follow up.    Contact information:  [address]  [place] [address]                    Discharge Medication List      You have not been prescribed any medications.         Signed by: Richarda Osmond, MD

## 2020-07-18 NOTE — Discharge Instr - AVS First Page (Addendum)
Reason for your Hospital Admission:  Homelessness  Depression  Chronic back pain  Previous known thyroid nodule with normal thyroid stimulating hormone      Post hospital instruction:  -follow up with transitional care clinic for thyroid nodule work up

## 2020-07-18 NOTE — Progress Notes (Signed)
Patient admitted from ED, alert and oriented x4, able to make needs known, denies any pain, no acute distress noted, patient denies any suicide ideation, stated she needs to sleep, she stated she's homeless and would like placement. Patient continent x2 and ambulatory, Patient oriented to unit and use of call light. Patient educated about safety measures, understanding verbalized. Skin assessment done with another nurse. Safety precautions maintained, will continue with plan of care.

## 2020-07-18 NOTE — Plan of Care (Signed)
Problem: Compromised Tissue integrity  Goal: Damaged tissue is healing and protected  Outcome: Progressing  Flowsheets (Taken 07/18/2020 1640)  Damaged tissue is healing and protected:   Monitor/assess Braden scale every shift   Provide wound care per wound care algorithm   Increase activity as tolerated/progressive mobility   Avoid shearing injuries   Keep intact skin clean and dry   Use bath wipes, not soap and water, for daily bathing   Use incontinence wipes for cleaning urine, stool and caustic drainage. Foley care as needed   Monitor external devices/tubes for correct placement to prevent pressure, friction and shearing   Encourage use of lotion/moisturizer on skin   Monitor patient's hygiene practices  Goal: Nutritional status is improving  Outcome: Progressing  Flowsheets (Taken 07/18/2020 1640)  Nutritional status is improving:   Allow adequate time for meals   Encourage patient to take dietary supplement(s) as ordered

## 2020-07-18 NOTE — Progress Notes (Signed)
Diagnosis: Depression acute    Healthcare Decisions  Advance Directive: Patient does not have advance directive  Healthcare Agent Appointed: No    Prior to admission  Type of Residence: Homeless  Living Arrangements: Alone  Dressing: Independent  Grooming: Independent  Feeding: Independent  Bathing: Independent  Toileting: Independent    Discharge Planning  Support Systems: None  Patient expects to be discharged to:: Homeless shelter     Consults/Providers  PT Evaluation Needed: No  OT Evalulation Needed: No  SLP Evaluation Needed: No     No PCP and no insurance. CM called [facility] @  2299, patient on their list for screening and possible qualified resources.  CM will follow as appropriate

## 2020-07-18 NOTE — Progress Notes (Signed)
MD ordered for Covid-19 vaccine to be administer to patient however, per pharmacist, hold on to vaccine until day of discharge, patient made aware.  Covid-19 vaccine to be given to patient prior to discharge.

## 2020-07-19 ENCOUNTER — Inpatient Hospital Stay
Admission: RE | Admit: 2020-07-19 | Discharge: 2020-07-24 | DRG: 880 | Disposition: A | Discharge status: Home / Self Care | Payer: Self-pay | Source: Intra-hospital | Attending: Psychiatry | Admitting: Psychiatry

## 2020-07-19 ENCOUNTER — Encounter (INDEPENDENT_AMBULATORY_CARE_PROVIDER_SITE_OTHER): Payer: Self-pay

## 2020-07-19 DIAGNOSIS — R110 Nausea: Secondary | ICD-10-CM | POA: Diagnosis not present

## 2020-07-19 DIAGNOSIS — Z560 Unemployment, unspecified: Secondary | ICD-10-CM

## 2020-07-19 DIAGNOSIS — Z599 Problem related to housing and economic circumstances, unspecified: Secondary | ICD-10-CM

## 2020-07-19 DIAGNOSIS — T43225A Adverse effect of selective serotonin reuptake inhibitors, initial encounter: Secondary | ICD-10-CM | POA: Diagnosis not present

## 2020-07-19 DIAGNOSIS — Z590 Homelessness: Secondary | ICD-10-CM

## 2020-07-19 DIAGNOSIS — F411 Generalized anxiety disorder: Principal | ICD-10-CM | POA: Diagnosis present

## 2020-07-19 DIAGNOSIS — Z758 Other problems related to medical facilities and other health care: Secondary | ICD-10-CM

## 2020-07-19 DIAGNOSIS — R45851 Suicidal ideations: Secondary | ICD-10-CM | POA: Diagnosis present

## 2020-07-19 DIAGNOSIS — Z8673 Personal history of transient ischemic attack (TIA), and cerebral infarction without residual deficits: Secondary | ICD-10-CM

## 2020-07-19 DIAGNOSIS — F39 Unspecified mood [affective] disorder: Secondary | ICD-10-CM

## 2020-07-19 LAB — CBC
Absolute NRBC: 0 10*3/uL (ref 0.00–0.00)
Hematocrit: 41.6 % (ref 34.7–43.7)
Hgb: 13.4 g/dL (ref 11.4–14.8)
MCH: 32.4 pg (ref 25.1–33.5)
MCHC: 32.2 g/dL (ref 31.5–35.8)
MCV: 100.5 fL — ABNORMAL HIGH (ref 78.0–96.0)
MPV: 10 fL (ref 8.9–12.5)
Nucleated RBC: 0 /100 WBC (ref 0.0–0.0)
Platelets: 191 10*3/uL (ref 142–346)
RBC: 4.14 10*6/uL (ref 3.90–5.10)
RDW: 13 % (ref 11–15)
WBC: 7.65 10*3/uL (ref 3.10–9.50)

## 2020-07-19 LAB — COVID-19 (SARS-COV-2): SARS CoV 2 Overall Result: NEGATIVE

## 2020-07-19 MED ORDER — HYDROXYZINE PAMOATE 25 MG PO CAPS
25.0000 mg | ORAL_CAPSULE | ORAL | Status: DC | PRN
Start: 2020-07-19 — End: 2020-07-24

## 2020-07-19 MED ORDER — DOCUSATE SODIUM 100 MG PO CAPS
100.0000 mg | ORAL_CAPSULE | Freq: Two times a day (BID) | ORAL | Status: DC | PRN
Start: 2020-07-19 — End: 2020-07-24

## 2020-07-19 MED ORDER — QUETIAPINE FUMARATE 25 MG PO TABS
50.0000 mg | ORAL_TABLET | Freq: Every evening | ORAL | Status: DC | PRN
Start: 2020-07-19 — End: 2020-07-24

## 2020-07-19 MED ORDER — ALUM & MAG HYDROXIDE-SIMETH 200-200-20 MG/5ML PO SUSP
30.0000 mL | Freq: Four times a day (QID) | ORAL | Status: DC | PRN
Start: 2020-07-19 — End: 2020-07-24

## 2020-07-19 MED ORDER — LOPERAMIDE HCL 2 MG PO CAPS
2.0000 mg | ORAL_CAPSULE | ORAL | Status: DC | PRN
Start: 2020-07-19 — End: 2020-07-24

## 2020-07-19 MED ORDER — IBUPROFEN 400 MG PO TABS
400.0000 mg | ORAL_TABLET | Freq: Four times a day (QID) | ORAL | Status: DC | PRN
Start: 2020-07-19 — End: 2020-07-24
  Administered 2020-07-23: 22:00:00 400 mg via ORAL
  Filled 2020-07-19: qty 1

## 2020-07-19 MED ORDER — ONDANSETRON HCL 8 MG PO TABS
4.0000 mg | ORAL_TABLET | Freq: Three times a day (TID) | ORAL | Status: DC | PRN
Start: 2020-07-19 — End: 2020-07-24

## 2020-07-19 MED ORDER — DIPHENHYDRAMINE HCL 25 MG PO CAPS
50.0000 mg | ORAL_CAPSULE | Freq: Once | ORAL | Status: DC | PRN
Start: 2020-07-19 — End: 2020-07-24

## 2020-07-19 MED ORDER — DIPHENHYDRAMINE HCL 50 MG/ML IJ SOLN
50.0000 mg | Freq: Once | INTRAMUSCULAR | Status: DC | PRN
Start: 2020-07-19 — End: 2020-07-24

## 2020-07-19 MED ORDER — OLANZAPINE 10 MG PO TABS
10.0000 mg | ORAL_TABLET | Freq: Two times a day (BID) | ORAL | Status: DC | PRN
Start: 2020-07-19 — End: 2020-07-24

## 2020-07-19 NOTE — Progress Notes (Addendum)
CM  Spoke with Physical therapist, patient to follow up with outpatient therapy. Patient with discharge order. CM spoke with bedside RN - says patient is on suicidal watch due to note patient wrote.-has 1;1  For safetyNoted on MD's note she will consult psych.  1659  Noted psych consults and recommendation, patient for possible transfer to psych unit if she voluntarily agrees to go.

## 2020-07-19 NOTE — PT Eval Note (Signed)
 [facility]  [address]  [place], [place] [address]  [phone]    Physical Therapy Evaluation    Patient: [name] MRN: [id number]   Unit: 3B ONCOLOGY GENERAL MEDICINE Bed: A213/Y865-78    Time of Treatment: Time Calculation  PT Received On: 07/19/20  Start Time: 0845  Stop Time: 0910  Time Calculation (min): 25 min    Consult received for [name] for PT evaluation and treatment.  Patient's medical condition is appropriate for Physical Therapy  intervention at this time.    Interpreter utilized: no, not indicated      D/C Suggestions   Based on today's performance:  Recommendation  Discharge Recommendation: Other(Comment) (medical homeless shelter with f/u PT)  DME Recommended for Discharge: Front wheel walker  PT Frequency: one time visit - therapy discontinued.    Transport Recommendations: no limitations    Discharge recommendations are based on patient's progression/regression. Please see most recent note for updated discharge recommendations.    Assessment   [name] is a 59 y.o. female admitted 07/17/2020 with depression.  Pt was evicted from her apt on the day of admission and has no friend's or family to sta with.  She also has a dog that has been displaced.  Pt did not meet criteria for  Psych admission.  She was ind with all functional mobility and ADL's. IADL's.   She takes a cab to run errands.  She admits furniture surfing at times.    Today pt is has no c/o pain.  She is still worried about where she will go upon d/c.  Pt states she has fallen 4 times in the last 2 months and does not recall the circumstances.  She has a h/o CVA with a bit of L side weakness.  Pt is ind to stand and spv to amb 300' with RW; no LOB, but LLE with decreased clearance.   SHe is ind with toileting and pericare.  If possible pt would benefit from d/c to medical homeless shelter with f/u PT.  She will need a RW upon d/c.  No skilled pT at this time.  Pt is d/c from  PT.      Complexity Level Hx and Co  morbidites Examination Clinical Decision Making Clinical Presentation   Low  no impact 1-2 elements Limited options Stable     PMP - Progressive Mobility Protocol   PMP Activity: Step 7 - Walks out of Room  Distance Walked (ft) (Step 6,7): 300 Feet (with RW)       Rehabilitation Potential:   Prognosis: Good;With continued PT status post acute discharge    Interdisciplinary Communication: RN      Plan       Plan  Risks/Benefits/POC Discussed with Pt/Family: With patient  Treatment/Interventions: Gait training;Functional transfer training;Patient/family training;Equipment eval/education;Bed mobility;Compensatory technique education;No skilled interventions needed at this time  PT Frequency: one time visit - therapy discontinued         Medical Diagnosis: Depression, acute [F32.9]  COVID-19 ruled out by laboratory testing [Z20.822]    History of Present Illness: [name] is a 59 y.o. female admitted on  07/17/2020 with  depression because she was evicted from her home earlier today and is currently homeless.  She is scared and felt that her life is falling apart and that she has nowhere to go.  All the shelters have closed and she is desperate.  Psych did an evaluation and did not feel she met  admission for psych issues since she is not suicidal      Patient Active Problem List   Diagnosis   . Depression, acute     Past Medical History:   Diagnosis Date   . Bilateral breast cysts 1980's    'non cancerous'   . Cerebrovascular accident 2000    "Mini strokes"   . Convulsions 1970    'Petite mal'  No medications.     Past Surgical History:   Procedure Laterality Date   . LEG SURGERY Left     Plate placed lower extremmity.       X-Rays/Tests/Labs:  Lab Results   Component Value Date/Time    HGB 13.4 07/19/2020 04:33 AM    HCT 41.6 07/19/2020 04:33 AM    K 4.3 07/17/2020 05:16 PM    NA 143 07/17/2020 05:16 PM    TROPI <0.01 07/07/2020 12:58 PM         Social History: Pt is  currently homeless (new)  She was living alone in an apt with 4STE with rail    Equipment at home:  tub/shower combination  Prior Level of Function:     Cognition: wfl    Mobility/Locomotion: furn surfs   Feeding: ind   Grooming: ind   Bathing: ind   Dressing: ind   Toileting: ind  She takes a cab for errands  Subjective   Patient is agreeable to participation in the therapy session. Nursing clears patient for therapy.  Patient's Goal:  t find a place to live  Pain: 1/10  Location: unable to localize  Therapist Intervention: positioned for comfort  Patient is satisfied with therapist intervention.    Objective     Precautions/ Contraindications:   Precautions  Weight Bearing Status: no restrictions  Other Precautions: falls, L side a bit weak    Patient is in bed with  Intravenous Access, Bed Alarm and sitter in place.       Observation of patient/vitals:      Vitals:    07/18/20 2045 07/19/20 0019 07/19/20 0417 07/19/20 0758   BP: 115/72 105/65 102/65 (!) 134/98   Pulse: 70 65 (!) 59 70   Resp: 18 18 18 19    Temp: 98.2 F (36.8 C) 98.1 F (36.7 C) 97.7 F (36.5 C) 98.2 F (36.8 C)   TempSrc: Oral Oral Oral Oral   SpO2: 93% 92% 94% 92%   Weight:       Height:           Orientation/Cognition:  Alert and Oriented x 4, cues for date  Cognition: impaired insight, cues to stay on task, impaired safety      Musculoskeletal Examination:     All 4 extremities, trunk and c-sp are wfl for ROM and strength; L side is a bit weak, but does not inhibit mobility or toileting  Sensation: L side feels "different"  Coordination: good, thumb finger opposition is a bit slow    Functional Mobility:  Rolling: ind    Supine to sit: ind  Scooting: ind  Sit to Supine: ind  Sit to stand: ind  Stand to sit: ind  Transfers: ind    Ambulation:     Weightbearing: fwb   Assistance level: spv   Distance: 300'   Assistive Device: RW   Gait Deviations: no LOB, decreased clearance LLE       Balance:  Static Sit: good  Static Stand: good  Dynamic  Stand: with RW    Endurance: fair+ with  RW    Participation:  Good, cues to stay on task    Education:  Educated the patient to role of physical therapy, plan of care, goals  of therapy, rationale for progressing mobility and safety with mobility and ADLs.    RN notified of session outcome and that patient was left in a chair with sitter present with all needs met and equipment intact.   Safety measures include: handoff to nurse/clin tech/ unit secretary completed, oriented to call bell and placed within reach, personal items within reach, assistive device positioned out of reach and bed placed in lowest position.   Mobility and ADL status posted at bedside and within E.M.R.    AM-PACT Inpatient Short Forms  Inpatient AM-PACT Performed? (PT): Basic Mobility Inpatient Short Form   AM-PACT "6 Clicks" Basic Mobility Inpatient Short Form  Turning Over in Bed: None  Sitting Down On/Standing From Armchair: None  Lying on Back to Sitting on Side of Bed: None  Assist Moving to/from Bed to Chair: None  Assist to Walk in Hospital Room: None  Assist to Climb 3-5 Steps with Railing: None  PT Basic Mobility Raw Score: 24  CMS 0-100% Score: 0.00%                           Therapist PPE during session procedural mask, face shield  and gloves     Signature:  Novella Olive, PT  07/19/2020  9:40 AM  Phone: [phone]     (For scheduling questions, please contact rehab tech [phone])      Attention MD:   Thank you for allowing [place] to participate in the care of Yahaira L Braniff. Regulations from the [employer] for Medicare and Medicaid Services (CMS) require your review and approval of this plan of care.     Please cosign this note indicating you are in agreement with theTherapy Plan of Care so we may initiate the therapy treatment plan.

## 2020-07-19 NOTE — Progress Notes (Signed)
Patient alert and oriented x 3. Standby assist to the bathroom. Received heparin sq for DVT prophylaxis with no bleeding noted. Patient slept most of the night in no visible distress. Just this morning when RN went to round on patient she brought out a note and told RN that, it should be on record. Review note for detail. Note handed over to Charge nurse and Attending notified.  Patient  Is be on suicidal watch per MD. On 1:1 observation at this time.  Patient already had a psych consult.

## 2020-07-19 NOTE — Progress Notes (Signed)
[facility] [employer] (previously [facility]):     Received a referral to schedule a follow up appointment with the [facility] for [employer].  Appointment scheduled for Thursday 07/27/2020 at 9:00 am    Clinic address is as follows:      [address] [place], [address]      Please notify patient to arrive 15 minutes early to the appointment and bring the following materials with them:    Insurance card (if insured) and photo ID  Medications in their original bottles  Glucometer/blood sugar log (if diabetic)  Weight log (if heart failure)  Proof of income (to enroll in medication assistance programs-first two pages of signed 1040 tax forms or last 2 months of pay stubs)      Celine Mans   Patient Access II  [facility] [employer]   (Previously-[facility])  P: [phone]  F: [phone]
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
## 2020-07-19 NOTE — Discharge Summary -  Nursing (Signed)
Pt is discharge and readmit to 311-1. Pt is alert and oriented x 4. Pt gave the night shift nurse note about her will. Dr notified. Psych doctor consulted. Pt is going to 3A. Pt has given consent for covid 19 vaccine. Vaccine will be done on 3A unit tomorrow. Nurse is aware. Pt needs minimal assist. IV removed. All safety precaution has been done. Sitter at the bedside. Pt is stable medically while going to 3A. Hands off report given to Mariatu.

## 2020-07-20 ENCOUNTER — Encounter: Payer: Self-pay | Admitting: Psychiatry

## 2020-07-20 MED ORDER — SERTRALINE HCL 50 MG PO TABS
25.0000 mg | ORAL_TABLET | Freq: Every day | ORAL | Status: DC
Start: 2020-07-20 — End: 2020-07-21
  Administered 2020-07-20 – 2020-07-21 (×2): 25 mg via ORAL
  Filled 2020-07-20 (×2): qty 1
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
## 2020-07-20 NOTE — Plan of Care (Signed)
Patient has not attended any groups thus far today.   Will continue to encourage group attendance and participation in order to increase positive coping skills and express feelings regarding current situation.

## 2020-07-20 NOTE — Psych Admission Note (Signed)
PSYCHIATRY - HISTORY and PHYSICAL    Date/Time:  07/20/20 12:05 PM  Patient Name: [name], [name]  MRN:  [id number]  Age: 59 y.o.  DOB: 11/17/1961    Chief Complaint:   Depression    The chart was reviewed, patient examined, and case discussed with staff. Initial treatment plain was reviewed.    For narrative sections such as history, impression, and plan please see history dictated this date    Voluntary    HPI:   59 yr old admitted from [facility] unit 3B under Voluntary status  for SI with no plan and DX of depression. Pt stated goal for inpatient treatment "to get back in track, fix my stress and paranoia". Agrees to trial sertraline. She appears to be interested in attending groups at this time and is interested in wellness circle.     Past Psychiatric History:   No prior inpatient psychiatric hospitalizations. Patient has had SI in the past although unclear if she has had SA per EMR. She has no treaters. She has had a therapist remotely in the past.     Current Treatment:   Currently in treatment with: none    Substance Abuse History:   Patient denies nicotine, alcohol, or other recreational substance use.     Past Surgical History:     Past Surgical History:   Procedure Laterality Date    LEG SURGERY Left     Plate placed lower extremmity.     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     Home Medications:     No medications prior to admission.     Allergies:   No Known Allergies    Social History:     Social History     Tobacco Use    Smoking status: Never Smoker    Smokeless tobacco: Never Used    Tobacco comment: no use of cigarettes in the past 30 days    Substance Use Topics    Alcohol use: Yes     Comment: celebratory, No alcohol use in the past 12 months      Functioning Relationships: Partner  Education: no schooling beyond  high school    Family History:   History reviewed. No pertinent family history.    Review of Systems:   Psychiatry Review of Symptoms:  Major depressive disorder (5X 2w)  mood is reported as anxious, sleep is intact, interest is unchanged, feeling of guilt endorsed, feelings of worthlessness endorsed, energy is unchanged, psychomotor activity is unchanged and suicidal ideation denied currently    Mania (3 x 1w)  NA    Psychosis  NA    Generalized anxiety disorder (3 X 34m)  worry and restlessness    Panic (4+)  NA    Post traumatic stress disorder (>1 month)  intrusive thoughts, flashbacks and avoidance    Obsessive compulsive disorder  NA    Attention deficit/hyperactivity disorder  NA    Vital Signs:  Vitals:    07/20/20 0745   BP: 95/62   Pulse: 63   Resp: 18   SpO2: 95%       Patient Vitals for the past 8 hrs:   BP Pulse Resp SpO2   07/20/20 0745 95/62 63 18 95 %  Physical exam:  Deferred for medical consultant     Mental Status Evaluation:     Appearance:  age appropriate and casually dressed   Behavior:  restless and fidgety   Speech:  soft   Mood:  anxious   Affect:  mood-congruent   Thought Process:  goal directed   Thought Content:  Appropriate to conversation   Sensorium:  person, place, time/date, situation and day of week   Cognition:  grossly intact   Insight:  limited   Judgment:  limited     Clinical Formulation/Reason for Admission:   59 unemployed, homeless, PPH depression, anxiety, possible PTSD, admitted for depression and SI. Patient has limited supports and has no treaters in the community. Appropriate for SSRI trial and step down to wellness circle:     Diagnosis:      Axis I: Mood Disorder   Axis ZO:XWRUE   Axis III: See problem list in the medical record   Axis IV: Housing problems   Occupational problems   Problems with access to health care services   Axis V:61-70: mild symptoms.    Indications for Admission:   I certify Inpatient stay indicated for SI.    Plan:   Patient admitted  to unit 3A, patient's current medications are to be continued, the following medications are being prescribed: sertraline, a comprehensive treatment plan will be developed and side effects of medications have been reviewed with the patient    I. Medication Plan:    #Mood:  - sertraline 25mg  PO daily      II. Medication Education: Medication risks and benefits including side effects were discussed with patient and informed consent was obtained      III. Medical work-up plan/testing:   Medical consultation appreciated      IV. Plan for Family Involvement:        -SOCIAL WORK: collateral with family as appropriate     V. Aftercare Plan/social work:  - Museum/gallery conservator referral     VI. Ongoing hospitalization required for:   -SI     VII. Other Providers Contact Information and Dates Contacted:  N/A      On this admission patient educated about and provided input into their treatment plan.  Patient understands potential risks and benefits of proposed treatment plan.    Goals   Stabilization, diagnostic clarification, treatment, collateral, aftercare planning    Length of Stay     Expected length of stay:  3 - 5 days  Expected disposition:  TBD  Total floor time:  60 minutes  Greater than half the time spent in counseling and coordination of care:  yes    Signed by: Edwyna Shell, MD   07/20/20 12:05 PM

## 2020-07-20 NOTE — Plan of Care (Signed)
Problem: Safety  Goal: Patient will be free from injury during hospitalization  Outcome: Progressing  Fayola remains safe on the unit.      Problem: At Risk for Suicide AS EVIDENCED BY...  Goal: Verbalizes understanding of medication, benefits, and side effects  Outcome: Progressing  Shamel verbalized understanding the use of her medication  Goal: Identify and participate in supportive program therapies  Outcome: Not Progressing   Has not attended unit activities  Problem: Mood Disorder  Goal: Reports improved mood  Outcome: Not Progressing  Daenerys's mood remains unstable.    Destiney reported being here because she was evicted from her apartment and became suicidal, " I am homeless right now". She denies any plan to self hurt whatsoever. Unstable mood, fair insight and poor judgement with paranoid processing thoughts. She reported feeling dizzy after lunch but VS were stable as temp was 97.7, RR 18, HR 83, BP 123/83, O2 sat 95% of RA; patient was able to walk to her room with minimal assistance. No discomfort at this time.

## 2020-07-20 NOTE — Progress Notes (Addendum)
Admission note     59 yr old admitted from [facility] unit 3B under voluntary status for SI with no plan and DX of depression. Pt has no known allergies. Per report Pt has hx of bilateral yeast and cyst, CVA and convulsion. Per report pt initially refused refused Covid vaccine but is now requesting consent to be given and asked her pet to be given away. Pt is homeless. Per report pt last BM was on "07/18/20". Pt needs minimal assistance, yellow socks given because have slightly unsteady gate.skin assessment done by day shift staff, Per report pt is on  Heparin and has scattered bruises , redness with rash under breast and groin area but has intact skin. Currently pt is AOx4, pleasant mood and affect. Pt denies SI/HI/AVH and pain. Pt refused for admission assessment stating " the unit had overwhelmed me and I would like go to sleep. Would like to do it tomorrow" .  Care plan completed and Admission to be completed by oncoming nurse.

## 2020-07-21 MED ORDER — ESCITALOPRAM OXALATE 10 MG PO TABS
5.0000 mg | ORAL_TABLET | Freq: Every day | ORAL | Status: DC
Start: 2020-07-21 — End: 2020-07-24
  Administered 2020-07-21 – 2020-07-24 (×4): 5 mg via ORAL
  Filled 2020-07-21 (×4): qty 1

## 2020-07-21 NOTE — Plan of Care (Signed)
Problem: At Risk for Suicide AS EVIDENCED BY...  Goal: Will identify long and short term goals based on individual needs and strengths  Outcome: Progressing  Goal: Suicide Alert Level Low  Outcome: Progressing  Goal: Identify and participate in supportive program therapies  Outcome: Progressing     Problem: Mood Disorder  Goal: Reports improved mood  Outcome: Progressing  Pt is alert and oriented x4. She spent her evening isolative to room. She denies SI, HI, depression, SIB, a/v hallucination. Pt endorsed anxiety and attributes it to her current situation. Pt presented with blunted affect, depressed mood, fair insight, good judgement, normal eye contact. Pt is eating and hydrating adequately. She is calm and cooperative

## 2020-07-21 NOTE — Progress Notes (Signed)
PSYCHIATRY INPATIENT PROGRESS NOTE    Date/Time: 07/21/20 11:42 AM  Patient's Name: [name], [name]  MR#:: [id number]  DOB: Aug 06, 1961    Medications:   Current medications and doses:    Current Facility-Administered Medications   Medication Dose Route Frequency Provider Last Rate Last Admin    alum & mag hydroxide-simethicone (MAALOX PLUS) 200-200-20 mg/5 mL suspension 30 mL  30 mL Oral Q6H PRN Berneta Levins, MD        diphenhydrAMINE (BENADRYL) capsule 50 mg  50 mg Oral Once PRN Berneta Levins, MD        Or    diphenhydrAMINE (BENADRYL) injection 50 mg  50 mg Intramuscular Once PRN Berneta Levins, MD        docusate sodium (COLACE) capsule 100 mg  100 mg Oral BID PRN Berneta Levins, MD        hydrOXYzine (VISTARIL) capsule 25 mg  25 mg Oral Q4H PRN Berneta Levins, MD        ibuprofen (ADVIL) tablet 400 mg  400 mg Oral Q6H PRN Berneta Levins, MD        loperamide (IMODIUM) capsule 2 mg  2 mg Oral Q3H PRN Berneta Levins, MD        OLANZapine (ZyPREXA) tablet 10 mg  10 mg Oral BID PRN Berneta Levins, MD        ondansetron [facility]) tablet 4 mg  4 mg Oral Q8H PRN Berneta Levins, MD        QUEtiapine (SEROquel) tablet 50 mg  50 mg Oral QHS PRN Berneta Levins, MD        sertraline (ZOLOFT) tablet 25 mg  25 mg Oral Daily Villa Burgin, Pasty Arch, MD   25 mg at 07/21/20 7829     Legal Status:   Voluntary    Subjective Findings:      Symptoms: anxiety - improving   Medication Side Effects:  Nausea with sertraline   Collateral History:   None today    Family Involvement: none today   Psychosocial Functioning: unemployed, home   Attending Groups: Patient indicates yes    Objective Findings:   Patient reports experiencing nausea from sertraline. Will trial lexapro instead. Patient is med compliant and maintains safety on the unit. Patient is interested in wellness circle placement.     Mental Status Evaluation:     Appearance:  age appropriate and casually dressed   Behavior:  restless and fidgety   Speech:  normal  pitch and normal volume   Mood:  anxious   Affect:  mood-congruent   Thought Process:  goal directed   Thought Content:  obsessions   Sensorium:  person, place, time/date and situation   Cognition:  grossly intact   Insight:  limited   Judgment:  limited     Neuro Vegetative Functions:      Energy: no significant change   Sleep: no significant change   Appetite: no significant change    Vital Signs:     Vitals:    07/21/20 0821   BP: 100/68   Pulse: (!) 59   Resp: 18   Temp: 98.1 F (36.7 C)   SpO2: 96%        Laboratory Assessment:     Results     ** No results found for the last 24 hours. **           Results     None           Assessment:   Clinical formulation: 59 unemployed,  homeless, PPH depression, anxiety, possible PTSD, admitted for depression and SI. Patient has limited supports and has no treaters in the community. Appropriate for SSRI trial and step down to wellness circle:     Diagnoses:      Axis I:Mood Disorder   Axis ZO:XWRUE   Axis III:See problem list in the medical record   Axis AV:WUJWJXB problems   Occupational problems   Problems with access to health care services   Axis V:61-70: mild symptoms.    Plan:     I.Medication Plan:    #Mood:  - sertraline 25mg  PO daily     II.Medication Education: Medication risks and benefits including side effects were discussed with patient and informed consent was obtained     III.Medical work-up plan/testing:  Medical consultation appreciated     IV.Plan for Family Involvement:    -SOCIAL WORK: collateral with family as appropriate    V.Aftercare Plan/social work:  - Museum/gallery conservator referral     VI.Ongoing hospitalization required for:  -SI     VII.Other Providers Contact Information and Dates Contacted:  N/A     On this admission patient educated about and provided input into their treatment plan. Patient understands potential risks and benefits of proposed treatment plan.    Expected length of stay: 3-5 days  Total floor  time: 45 minutes  At least 50% in coordination of care and counseling:Yes        Signed by: Edwyna Shell, MD   07/21/20 11:42 AM

## 2020-07-21 NOTE — Plan of Care (Signed)
Patient attended community meeting thus far today. Patient shared her goal for the day was to "make it through the day without collapsing". Patient has been isolative to room. Will continue to encourage group attendance and participation in order to increase positive coping skills and express feelings regarding current situation.

## 2020-07-21 NOTE — Progress Notes (Signed)
SW met with patient to discuss discharge. Patient reported she was not ready to sign forms for [employer]. SW and patient discussed if two options for discharge: Programmer, applications with outpatient services. SW will follow up with patient on Monday regarding discharge disposition.

## 2020-07-21 NOTE — Treatment Plan (Cosign Needed)
Interdisciplinary Treatment Plan Update Meeting    07/21/2020  [name]    Participants:  Patient:  [name]  Attending Physician:  Edwyna Shell, MD  RN: Vida Roller  Mental Health Therapist: Renard Hamper  Social Worker: Nyoka Cowden      Objective:  Review response to treatment, reassess needs/goals, update plan as indicated incorporating patient's strengths and stated needs, goals, and preferences.      1. Summary of Patient Progress on Treatment Plan Goals:  Patient is a 54 Caucasian female admitted from medical unit to 3A due to expressing suicidal thoughts. Per the report, patient was evicted from her apartment and was now homeless which triggered her suicidal thoughts. During the treatment team meeting, patient reported a side effect on medication and so medication will be adjusted to help alleviate the side effects. During the medication patient consented to medication.    The plan discussed was her patient to attend groups and be referred to [facility]. Current psychiatric medication is Lexapro 5 mg.     2. Level of Patient Involvement:  Actively engaged/contributing    3. Patient Understanding of Plan of Care:  Able to verbalize goals and interventions    4. Level of Agreement/Commitment to Plan of Care:  Agrees with and is committed to plan of care          Contributor Signatures:      MD_________________________________ Date___________________    SW_________________________________Date ___________________    RN _________________________________Date____________________    Patient_______________________________Date____________________    Other________________________________Date ___________________    Other________________________________Date ___________________    (This document is signed electronically by Clinical research associate and electronic co-signer.  Other participants sign a printed copy which is scanned into the EMR)

## 2020-07-21 NOTE — Plan of Care (Addendum)
Problem: At Risk for Suicide AS EVIDENCED BY...  Goal: Will identify long and short term goals based on individual needs and strengths  Outcome: Not Progressing  Goal: Identifies triggers and protective strategies  Outcome: Not Progressing  Goal: Completes discharge safety and recovery plan  Outcome: Not Progressing     Problem: Mood Disorder  Goal: Reports improved mood  Outcome: Not Progressing   Pt received alert and oriented X 3, presented with unstable mood, blunted affect, calm and cooperative attitude. She denies SI/HI/AVH and pain but endorses mild anxiety. She was able to attend treatment meeting, encouraged to attend group.  Pt requested for chaplain visitation still pending visitation.  she was very tearful today due to some psychotic patient bothering her and staff was able to comfort her and provided her needs. Pt is eating and hydrating well, no agitation noted and continue on Q 15 mins for safety.

## 2020-07-21 NOTE — Progress Notes (Addendum)
[name] is a 59 year old female who was voluntarily admitted. [name] has limited support system and does not have outpatient providers.  Patient is homeless and expects to be discharged to [facility]. TDD: 3-7 days.

## 2020-07-21 NOTE — UM Notes (Signed)
IP UR review  VOL  Doa: 07/19/20  ------------  Transfer from medical /OBS  IP:  Inab care self,  Paranoid thoughts  --------------------------  Per Intake Liaison: summary    59 y.o.  female brought to the [facility] Department due to complaint of back and side pain. Pt was assessed via Telepsych.Pt was calm, cooperative and easytoengaged in assessment. Ptmaintained good eye contact, was hyperverbal with pressured speech, but displayed no impairment in thought content, or thought process. Ptis A&Ox 4 with no memory impairment and congruent mood/affect. Pt denies SI/HI/SIB and AVH and there was no evidence of psychosis present at time of assessment. Insight and judgement are questionable as pt was just evicted from her apartment.  Pt reports poor sleep and appetite.  She expressed being fearful of "where to go from here" due to her recent eviction.     ---------------------  Per H/P: admit summary    Clinical Formulation/Reason for Admission:   59 unemployed, homeless, PPH depression, anxiety, possible PTSD, admitted for depression and SI. Patient has limited supports and has no treaters in the community. Appropriate for SSRI trial and step down to wellness circle:     Diagnosis:      Axis I:Mood Disorder   Axis ZO:XWRUE   Axis III:See problem list in the medical record   Axis AV:WUJWJXB problems   Occupational problems   Problems with access to health care services   Axis V:61-70: mild symptoms.    Indications for Admission:   I certify Inpatient stay indicated for SI.    Plan:   Patient admitted to unit 3A, patient's current medications are to be continued, the following medications are being prescribed: sertraline, a comprehensive treatment plan will be developed and side effects of medications have been reviewed with the patient    I.Medication Plan:    #Mood:  - sertraline 25mg  PO daily     ----------------------------    Currently Listed   Self Pay

## 2020-07-22 LAB — HEMOGLOBIN A1C
Average Estimated Glucose: 122.6 mg/dL
Hemoglobin A1C: 5.9 % (ref 4.6–5.9)

## 2020-07-22 NOTE — Plan of Care (Addendum)
Problem: At Risk for Suicide AS EVIDENCED BY...  Goal: Identify and participate in supportive program therapies  Outcome: Not Progressing     Problem: Mood Disorder  Goal: Reports improved mood  Outcome: Not Progressing     Problem: At Risk for Suicide AS EVIDENCED BY...  Goal: Will identify long and short term goals based on individual needs and strengths  Outcome: Progressing  Goal: Suicide Alert Level Low  Outcome: Progressing     Problem: Moderate/High Fall Risk Score >5  Goal: Patient will remain free of falls  Outcome: Progressing   Assumed patient care at the beginning of the shift. She is A&O X 4, denied SI/HI/AVH and pain. She presented with a depressed mood, slowed but normal behavior, cooperative attitude, a labile affect, with fair insight and judgment. She complained that she is scared of sitting in the milieu because 3 peers, 2 female and 1 female are making her feel unsafe. She is reassured of her safety on the unit. At about 0930, she reported that she was feeling shaky and sweating like she was going to have a stroke. Vital signs taken were normal. She is encouraged to drink more fluids. MD was notified. She is med compliant, eating and hydrating well, intermittently visible in the milieu watching TV interacting with selected peers, but mostly stayed in her room isolative to self. She remains free of falls at this time. Patient did not attend groups today. Safety is maintained. Staff will continue to monitor her for progress and safety, and provide support as needed.

## 2020-07-22 NOTE — Plan of Care (Signed)
Patient has not attended any groups thus far today.   Will continue to encourage group attendance and participation in order to increase positive coping skills and express feelings regarding current situation.

## 2020-07-22 NOTE — Plan of Care (Signed)
Problem: At Risk for Suicide AS EVIDENCED BY...  Goal: Suicide Alert Level Low  Outcome: Progressing  Pt denies current SI     Problem: Moderate/High Fall Risk Score >5  Goal: Patient will remain free of falls  Outcome: Progressing  Pt continues to be free from fall     Problem: Mood Disorder  Goal: Reports improved mood  Outcome: Not Progressing  Pt endorsed anxious mood with another peer.       Pt was received in her room at the beginning of shift resting in her bed. Pt reported that she feels uncomfortable coming to milieu because "3 female peers out there in the milieu are targeting me and they are making it uneasy & unsafe for me to sit in the milieu." Pt was assured of her safety and she was encouraged to voice any concern to staff for support. Pt denies current SI, HI & AVH and she continues to remain safe in the unit with no safety issue noted at the moment

## 2020-07-22 NOTE — Progress Notes (Signed)
PSYCHIATRY INPATIENT PROGRESS NOTE    Date/Time: 07/22/20 11:15 PM  Patient's Name: [name], [name]  MR#:: [id number]  DOB: [birth year]    Medications:   Current medications and doses:    Current Facility-Administered Medications   Medication Dose Route Frequency Provider Last Rate Last Admin    alum & mag hydroxide-simethicone (MAALOX PLUS) 200-200-20 mg/5 mL suspension 30 mL  30 mL Oral Q6H PRN Berneta Levins, MD        diphenhydrAMINE (BENADRYL) capsule 50 mg  50 mg Oral Once PRN Berneta Levins, MD        Or    diphenhydrAMINE (BENADRYL) injection 50 mg  50 mg Intramuscular Once PRN Berneta Levins, MD        docusate sodium (COLACE) capsule 100 mg  100 mg Oral BID PRN Berneta Levins, MD        escitalopram (LEXAPRO) tablet 5 mg  5 mg Oral Daily Mazher, Pasty Arch, MD   5 mg at 07/22/20 0932    hydrOXYzine (VISTARIL) capsule 25 mg  25 mg Oral Q4H PRN Berneta Levins, MD        ibuprofen (ADVIL) tablet 400 mg  400 mg Oral Q6H PRN Berneta Levins, MD        loperamide (IMODIUM) capsule 2 mg  2 mg Oral Q3H PRN Berneta Levins, MD        OLANZapine (ZyPREXA) tablet 10 mg  10 mg Oral BID PRN Berneta Levins, MD        ondansetron [facility]) tablet 4 mg  4 mg Oral Q8H PRN Berneta Levins, MD        QUEtiapine (SEROquel) tablet 50 mg  50 mg Oral QHS PRN Berneta Levins, MD         Legal Status:   Voluntary    Subjective Findings:      Symptoms: anxiety     07/22/2020:  Pt seen and evalauted at bedside. Pt reports that she still feels anxious and not safe to be on the unit, c/o another female patient entering her room, which caused her to be scared, didn't tell the staff so pt was encouraged to share and voice her concerns and was assured that patient safety is a priority and that we have staff to ensure patient safety, pt denies SI/HI/AVH.    Medication Side Effects:  Nausea with sertraline   Collateral History:   None today    Family Involvement: none today   Psychosocial Functioning: unemployed, home   Attending  Groups: not today    Objective Findings:   Patient reports experiencing nausea from sertraline. Will trial lexapro instead. Patient is med compliant. Patient is interested in wellness circle placement.     Mental Status Evaluation:     Appearance:  age appropriate and casually dressed   Behavior:  Calm and cooperative   Speech:  normal pitch and normal volume   Mood:  anxious   Affect:  mood-congruent   Thought Process:  goal directed   Thought Content:  obsessions   Sensorium:  person, place, time/date and situation   Cognition:  grossly intact   Insight:  limited   Judgment:  limited     Neuro Vegetative Functions:      Energy: no significant change   Sleep: no significant change   Appetite: no significant change    Vital Signs:     Vitals:    07/22/20 2030   BP: 100/62   Pulse: 71   Resp: 16   Temp: 98.3 F (36.8  C)   SpO2: 96%        Laboratory Assessment:     Results     Procedure Component Value Units Date/Time    Hemoglobin A1C [[id number]] Collected: 07/22/20 0559     Updated: 07/22/20 0957     Hemoglobin A1C 5.9 %      Average Estimated Glucose 122.6 mg/dL            Results     Procedure Component Value Units Date/Time    Hemoglobin A1C [[id number]] Collected: 07/22/20 0559     Updated: 07/22/20 0957     Hemoglobin A1C 5.9 %      Average Estimated Glucose 122.6 mg/dL            Assessment:   Clinical formulation: 59 unemployed, homeless, PPH depression, anxiety, possible PTSD, admitted for depression and SI. Patient has limited supports and has no treaters in the community. Appropriate for SSRI trial and step down to wellness circle:     Diagnoses:      Axis I:Mood Disorder   Axis NW:GNFAO   Axis III:See problem list in the medical record   Axis ZH:YQMVHQI problems   Occupational problems   Problems with access to health care services   Axis V:61-70: mild symptoms.    Plan:     I.Medication Plan:    #Mood:  - sertraline 25mg  PO daily     II.Medication Education: Medication risks and  benefits including side effects were discussed with patient and informed consent was obtained     III.Medical work-up plan/testing:  Medical consultation appreciated     IV.Plan for Family Involvement:    -SOCIAL WORK: collateral with family as appropriate    V.Aftercare Plan/social work:  - Museum/gallery conservator referral     VI.Ongoing hospitalization required for:  -SI     VII.Other Providers Contact Information and Dates Contacted:  N/A     On this admission patient educated about and provided input into their treatment plan. Patient understands potential risks and benefits of proposed treatment plan.    Expected length of stay: 3-5 days  Total floor time: 45 minutes  At least 50% in coordination of care and counseling:Yes        Signed by: Melynda Keller, MD   07/22/20 11:15 PM

## 2020-07-23 NOTE — Plan of Care (Signed)
Patient did not attend groups today.  This Clinical research associate met with patient and she stated her sleep improved last night.  She reiterated some of her concerns about the milieu and stated she preferred to rest today.  Patient was encouraged to come to staff if she is in need of anything.

## 2020-07-23 NOTE — Plan of Care (Signed)
Problem: At Risk for Suicide AS EVIDENCED BY...  Goal: Suicide Alert Level Low  Outcome: Progressing  Pt denies current SI     Problem: Moderate/High Fall Risk Score >5  Goal: Patient will remain free of falls  Outcome: Progressing  No fall noted     Problem: Mood Disorder  Goal: Reports improved mood  Outcome: Not Progressing      Pt continues to be visible in her room with no peer interaction noted except for her roommate. She continues to be paranoid about other peers stating that "they are talking about me and I can't stand them." Pt continues to denies current SI, HI & AVH and she continues to remain safe in the unit. No safety concerns noted at this time.

## 2020-07-23 NOTE — Plan of Care (Signed)
Problem: At Risk for Suicide AS EVIDENCED BY...  Goal: Suicide Alert Level Low  Outcome: Progressing  Flowsheets (Taken 07/23/2020 2355)  Suicide Alert Level Low:   Every 15 minute check   Reassess patient every shift using Suicide Assessment Tool (SAT)  Goal: Identifies triggers and protective strategies  Outcome: Progressing  Flowsheets (Taken 07/23/2020 2355)  Identifies triggers and protecive strategies:   Assist to identify triggers to aggressive/violent thoughts   Reinforce patient's efforts to manage anger and aggression   Assist to identify safe, positive strategies to cope     Problem: Mood Disorder  Goal: Reports improved mood  Outcome: Progressing  Flowsheets (Taken 07/23/2020 2355)  Reports improved mood:   Encourage the patient to verbalize feelings of anxiety, anger, and fears   Frequent brief 1:1 conversations with the patient to assess mood /coping response     Patient denies current SI/HI/AVH, anxiety but report 3/10 generalized pain. Presents with a depressed mood, labile affect, cooperative attitude, with fair insight and judgment. Appropriate but slowed behavior, spent the entire in bed. Requested for and received PRN Ibuprofen 400 mg with good effect. Maintains safety.

## 2020-07-23 NOTE — Progress Notes (Signed)
PSYCHIATRY INPATIENT PROGRESS NOTE    Date/Time: 07/23/20 1:48 PM  Patient's Name: [name], [name]  MR#:: [id number]  DOB: 10/20/1961    Medications:   Current medications and doses:    Current Facility-Administered Medications   Medication Dose Route Frequency Provider Last Rate Last Admin    alum & mag hydroxide-simethicone (MAALOX PLUS) 200-200-20 mg/5 mL suspension 30 mL  30 mL Oral Q6H PRN Berneta Levins, MD        diphenhydrAMINE (BENADRYL) capsule 50 mg  50 mg Oral Once PRN Berneta Levins, MD        Or    diphenhydrAMINE (BENADRYL) injection 50 mg  50 mg Intramuscular Once PRN Berneta Levins, MD        docusate sodium (COLACE) capsule 100 mg  100 mg Oral BID PRN Berneta Levins, MD        escitalopram (LEXAPRO) tablet 5 mg  5 mg Oral Daily Mazher, Pasty Arch, MD   5 mg at 07/23/20 0946    hydrOXYzine (VISTARIL) capsule 25 mg  25 mg Oral Q4H PRN Berneta Levins, MD        ibuprofen (ADVIL) tablet 400 mg  400 mg Oral Q6H PRN Berneta Levins, MD        loperamide (IMODIUM) capsule 2 mg  2 mg Oral Q3H PRN Berneta Levins, MD        OLANZapine (ZyPREXA) tablet 10 mg  10 mg Oral BID PRN Berneta Levins, MD        ondansetron [facility]) tablet 4 mg  4 mg Oral Q8H PRN Berneta Levins, MD        QUEtiapine (SEROquel) tablet 50 mg  50 mg Oral QHS PRN Berneta Levins, MD         Legal Status:   Voluntary    Subjective Findings:      Symptoms: anxiety     07/23/2020:  Pt seen and evalauted at bedside.  Patient reported that she still feels anxious and fearful about other patient, but denies any event last night or today.  Patient stated that she talked to her boyfriend who is hospitalized in [place], stated that her boyfriend works with a [employer], stated that she felt very well after his call.  Patient reports that she had a good night sleep, eating her food, mostly isolated in her room, requested to speak/see the chaplain, endorses nonspecific passive on or off suicidal ideation.  Patient stated that she does not have  immediate plan in terms of her discharge but stated that her main goal is to retrieve her dog and is planning to travel to [place] and lives there where she is hoping to meet her boyfriend and then live together there.        Medication Side Effects:  Nausea with sertraline   Collateral History:   None today    Family Involvement: none today   Psychosocial Functioning: unemployed, home   Attending Groups: not today    Objective Findings:   Patient reports experiencing nausea from sertraline. Will trial lexapro instead. Patient is med compliant. Patient is interested in wellness circle placement.     Mental Status Evaluation:     Appearance:  age appropriate and casually dressed   Behavior:  Calm and cooperative   Speech:  normal pitch and normal volume   Mood:  constricted   Affect:  mood-congruent   Thought Process:  goal directed   Thought Content:  obsessions   Sensorium:  person, place, time/date and situation   Cognition:  grossly intact   Insight:  limited   Judgment:  limited     Neuro Vegetative Functions:      Energy: no significant change   Sleep: no significant change   Appetite: no significant change    Vital Signs:     Vitals:    07/23/20 0739   BP: 132/75   Pulse: 64   Resp:    Temp: 98.2 F (36.8 C)   SpO2:         Laboratory Assessment:     Results     ** No results found for the last 24 hours. **           Results     Procedure Component Value Units Date/Time    Hemoglobin A1C [[id number]] Collected: 07/22/20 0559     Updated: 07/22/20 0957     Hemoglobin A1C 5.9 %      Average Estimated Glucose 122.6 mg/dL            Assessment:   Clinical formulation: 59 unemployed, homeless, PPH depression, anxiety, possible PTSD, admitted for depression and SI. Patient has limited supports and has no treaters in the community. Appropriate for SSRI trial and step down to wellness circle:     Diagnoses:      Axis I:Mood Disorder   Axis WU:JWJXB   Axis III:See problem list in the medical  record   Axis JY:NWGNFAO problems   Occupational problems   Problems with access to health care services   Axis V:61-70: mild symptoms.    Plan:     I.Medication Plan:    #Mood:  - Lexapro 5mg  PO daily     II.Medication Education: Medication risks and benefits including side effects were discussed with patient and informed consent was obtained     III.Medical work-up plan/testing:  Medical consultation appreciated     IV.Plan for Family Involvement:    -SOCIAL WORK: collateral with family as appropriate    V.Aftercare Plan/social work:  - Museum/gallery conservator referral     VI.Ongoing hospitalization required for:  -SI     VII.Other Providers Contact Information and Dates Contacted:  N/A     On this admission patient educated about and provided input into their treatment plan. Patient understands potential risks and benefits of proposed treatment plan.    Expected length of stay: 3-5 days  Total floor time: 20 minutes  At least 50% in coordination of care and counseling:Yes        Signed by: Melynda Keller, MD   07/23/20 1:48 PM

## 2020-07-23 NOTE — Plan of Care (Signed)
Problem: At Risk for Suicide AS EVIDENCED BY...  Goal: Identify and participate in supportive program therapies  Outcome: Not Progressing     Problem: At Risk for Suicide AS EVIDENCED BY...  Goal: Suicide Alert Level Low  Outcome: Progressing     Problem: Moderate/High Fall Risk Score >5  Goal: Patient will remain free of falls  Outcome: Progressing     Problem: Mood Disorder  Goal: Reports improved mood  Outcome: Progressing     Problem: Bleeding Precautions  Goal: Free from bleeding  Outcome: Progressing   Assumed patient care at the start of the shift. She is A&O X 4, denied SI/HI/AVH, anxiety and pain. She presented with a depressed mood, labile affect, cooperative attitude, slowed but appropriate behavior, with fair insight and judgment. She is encouraged to attend groups today. Patient is compliant with scheduled medications. She is eating and hydrating adequately, intermittently seen on the unit making calls but mostly stayed in her room interacting with roommate. She is on Q 15 minute observations and remains safe on the unit. Patient did not attend groups today. Bleeding precautions maintained. Patient remains free of falls at this time. Staff will continue to monitor her for safety and progress and provide support as needed.

## 2020-07-24 DIAGNOSIS — M549 Dorsalgia, unspecified: Secondary | ICD-10-CM | POA: Insufficient documentation

## 2020-07-24 DIAGNOSIS — Z8673 Personal history of transient ischemic attack (TIA), and cerebral infarction without residual deficits: Secondary | ICD-10-CM | POA: Insufficient documentation

## 2020-07-24 DIAGNOSIS — F22 Delusional disorders: Secondary | ICD-10-CM | POA: Insufficient documentation

## 2020-07-24 DIAGNOSIS — Z20822 Contact with and (suspected) exposure to covid-19: Secondary | ICD-10-CM | POA: Insufficient documentation

## 2020-07-24 DIAGNOSIS — F329 Major depressive disorder, single episode, unspecified: Secondary | ICD-10-CM | POA: Insufficient documentation

## 2020-07-24 DIAGNOSIS — R413 Other amnesia: Secondary | ICD-10-CM | POA: Insufficient documentation

## 2020-07-24 DIAGNOSIS — R109 Unspecified abdominal pain: Secondary | ICD-10-CM | POA: Insufficient documentation

## 2020-07-24 DIAGNOSIS — G8929 Other chronic pain: Secondary | ICD-10-CM | POA: Insufficient documentation

## 2020-07-24 LAB — LIPID PANEL
Cholesterol / HDL Ratio: 3.9
Cholesterol: 187 mg/dL (ref 0–199)
HDL: 48 mg/dL (ref 40–9999)
LDL Calculated: 122 mg/dL — ABNORMAL HIGH (ref 0–99)
Triglycerides: 84 mg/dL (ref 34–149)
VLDL Calculated: 17 mg/dL (ref 10–40)

## 2020-07-24 LAB — HEMOLYSIS INDEX: Hemolysis Index: 42 — ABNORMAL HIGH (ref 0–18)

## 2020-07-24 MED ORDER — ESCITALOPRAM OXALATE 10 MG PO TABS
5.0000 mg | ORAL_TABLET | Freq: Every day | ORAL | 0 refills | Status: AC
Start: 2020-07-25 — End: 2020-08-24

## 2020-07-24 NOTE — Discharge Summary (Incomplete)
Discharge 8_/_16_/_24_: Discharge order given. No visible distress or psychotic episode noted prior to discharge. Denied any discomfort including pain. Instructions provided along with appointments and medications explained to pt. Belongings  returned and completed inpatient survey and discharge safety plan.  Pt d/c to home accompanied by staff to car around 1615.

## 2020-07-24 NOTE — Discharge Instr - AVS First Page (Addendum)
[facility] Continuing Care Plan, including the After Visit Summary (AVS), the Psychiatrists Discharge Summary, and all 12 elements were faxed (mailed, routed electronically or can be viewed by [facility] providers who have access to Epic) on 8.16.2021 to [facility] @ [phone].      RECOMMENDED THAT YOU COMPLY WITH THE FOLLOWING PLAN:   Follow up appointment with: [facility]- Intake/Assessment                                                 Date: 07/25/2020        Time: 8:30AM     Location: [address], [place], [place], [address]     P: [phone]  F: [phone]    (Due to COVID-19 the above appointments will occur by tele-health. Please contact the above number to confirm/secure your appointment).    1. An appointment is provided at [facility], [address] [place], [place], [address] P:[phone]  for alcohol or drug use disorder counseling at :  Date:__8.17.2021___Time: 11 am __ Please call!      You can use the following numbers for emergencies:    o For Psychiatric Emergencies: Call 911 or [facility] at [phone]  o For Medical Emergencies: Call 911  o National Suicide Prevention Lifeline: Call 1-800-273-TALK (564)668-8012) or text   a message to 785-479-8825                                  We wish you well with your recovery.                Nyoka Cowden, LGSW  Case Manager   [facility]  [address], [place], [place]., [address]  P: [phone]  F: [phone]      The following were reviewed with patient/family by __Gianna G.__RN.    1. Reason for IP admission:   Diagnosis: __Mood disorder __     Precipitating event:__Anxiety depression __    2. Major procedures and tests, including summary of results  3. Diagnosis at discharge   Mood disorder   4. Current medication list    See avs   5. Were studies pending at discharge?           No                                                                                                                                                       6. Patient instructions;  Adult Wellness, Recovery, and Safety Plan  7. Emergency contact information   Please call the nurse's station  at [phone]  8. Plan for follow up care   See avs   9. Name of provider(s), appointment(s), and location(s) of follow up care.  See avs                                               Advance Care Plan  10. Patient had a medical and mental health Advance Directive at admission.                                                                            No        11. If patient did not have a medical and mental health Advance Directive at admission:  information about completing Advance Directives or designating a surrogate decision maker, and a form, was provided.  After receiving information- Did patient create a medical and mental health Advance Directive or appoint a surrogate decision maker?                                                                                                       No        If No:   12. If no, what is the reason?   ( Check reason)                 _____  Prefer to wait until I feel better        _____  Prefer to discuss with family or significant other        _____  Prefer to discuss with continuing care provider        ___X__  Do not believe I will need a mental health Advance Directive        _____  Other_____________________________________________

## 2020-07-24 NOTE — Progress Notes (Signed)
SW met with patient to discuss discharge. Patient reported she was looking for housing because patient was evicted on 07/18/20. Patient wanted to stay longer in the hospital until her money is deposited in her account next week. SW explained to patient that she is stable for discharge and the hospital does not provide housing.      Magdalina then reported she was interested in going to [employer] for housing purposes; however, patient admitted to not going to groups over the weekend. SW agreed to make a referral to [employer].     SW made a referral and spoke to Amy. SW was informed patient would not be accepted due to reported medical concerns and did not meet qualifications for admission such as attending groups.     SW met with patient and discussed follow up with the [facility] and going to a shelter. Shelter list also provided.

## 2020-07-24 NOTE — ED Triage Notes (Signed)
[name] is a 59 y.o. female c/o chills, constipation x 3 days while in psych ward, discharged today.  + insomnia ; requesting chaplin if available due the stress of life   Hx psych , bipolar , thyroid nodules,

## 2020-07-24 NOTE — Plan of Care (Signed)
Problem: Safety  Goal: Patient will be free from injury during hospitalization  Outcome: Progressing  Goal: Patient will be free from infection during hospitalization  Outcome: Progressing     Problem: Pain  Goal: Pain at adequate level as identified by patient  Outcome: Progressing     Problem: Side Effects from Pain Analgesia  Goal: Patient will experience minimal side effects of analgesic therapy  Outcome: Progressing     Problem: At Risk for Suicide AS EVIDENCED BY...  Goal: Will identify long and short term goals based on individual needs and strengths  Outcome: Progressing  Goal: Suicide Alert Level Low  Outcome: Progressing  Goal: Identifies triggers and protective strategies  Outcome: Progressing  Goal: Verbalizes understanding of medication, benefits, and side effects  Outcome: Progressing  Goal: Identify and participate in supportive program therapies  Outcome: Progressing  Goal: Completes discharge safety and recovery plan  Outcome: Progressing     Problem: Moderate/High Fall Risk Score >5  Goal: Patient will remain free of falls  Outcome: Progressing     Problem: Mood Disorder  Goal: Reports improved mood  Outcome: Progressing     Problem: Bleeding Precautions  Goal: Free from bleeding  Outcome: Progressing    Met Gwendolynn this morning in her rorom.her mood was unstable with a labile affect and her behavior was cooperative. She denies current SI/HI/AVH and is eating and hydrating. She presented with a disorganized thought process and her speech was tangential and circumstantial. She is isolated in her room, but interacts pleasantly with her roommate. Will continue to monitor for safety and needs.

## 2020-07-24 NOTE — Discharge Summary (Signed)
PSYCHIATRY DISCHARGE SUMMARY  AND POST DISCHARGE CONTINUING CARE PLAN    Date/Time: 07/24/2020 2:11 PM  Patient Name: [name], [name]  MRN#: [id number]  Age: 59 y.o.   Date of Birth: May 16, 1961    Date of Admission:   07/19/2020  Date of Discharge:     07/24/20  Admitting Physician:    Berneta Levins, MD  Discharge Physician:    Edwyna Shell, MD     Event leading to hospitalization / Reason for Hospitalization   Chief Complaint or Reason for Admission  59 yr old admitted [facility] for SI with no plan and DX of depression. Pt stated goal for inpatient treatment "to get back in track, fix my stress and paranoia". Agrees to trial sertraline. She appears to be interested in attending groups at this time and is interested in wellness circle.      Legal Status on admission was voluntary.    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      Axis IV   Housing problems   Axis V:  GAF at  Admission: 51-60: moderate symptoms.          GAF at Discharge:91-100: superior functioning.    Comorbid Conditions: Housing -unstable or none    Labs/Scans/EKG     Results     Procedure Component Value Units Date/Time    Lipid panel [[id number]]  (Abnormal) Collected: 07/24/20 0740    Specimen: Blood Updated: 07/24/20 0940     Cholesterol 187 mg/dL      Triglycerides 84 mg/dL      HDL 48 mg/dL      LDL Calculated 478 mg/dL      VLDL Calculated 17 mg/dL      Cholesterol / HDL Ratio 3.9    Hemolysis index [[id number]]  (Abnormal) Collected: 07/24/20 0740     Updated: 07/24/20 0940     Hemolysis Index 42    Hemoglobin A1C [[id number]] Collected: 07/22/20  0559     Updated: 07/22/20 0957     Hemoglobin A1C 5.9 %      Average Estimated Glucose 122.6 mg/dL         No results found for any visits on 07/19/20.  Cardiology Results     None          Consults:     Consult Orders (From admission, onward)    None        None.    Discharge Day Evaluation     Blood pressure (!) 87/56, pulse 60, temperature 98.5 F (36.9 C), temperature source Oral, resp. rate 16, SpO2 97 %.    General appearance: Appears chronological age, Good hygiene and Good grooming  Attitude/Behavior: Calm  Motor: No abnormalities noted  Gait: No obvious abnormalities  Muscle strength and tone: Grossly intact  Speech:   Spontaneous: Yes  Rate and Rhythm: Normal  Volume: Normal  Tone: Normal  Clarity: Yes  Mood: Euthymic  Affect:   Range: Full  Stability: Stable  Appropriateness to  thought content: Yes  Intensity: Normal  Thought Process:   Coherent: Yes  Logical:  Yes  Associations: Goal-directed  Thought Content:   Delusions:  Not elicited  Depressive Cognitions:  None  Suicidal:  No suicidal thoughts  Homicidal:  No homicidal thoughts  Violent Thoughts:  No  Perceptions:   Dissociative Phenomena:  No  Hallucinations: No  Illusions:  No  Insight: Fair  Judgment: Fair  Cognition:       Level of Consciousness: Intact  Orientation: Intact to self, place and time  Recent Memory: Intact  Remote Memory: Intact  Attention and Concentration: Intact  Language: Repetition: Intact  Recognition: Intact  Fund of Knowledge: Good     Review of Systems  A complete 14 point ROS was done and was negative    Hospital Course:   Patient admitted to 3A for stabilization, diagnostic clarification, treatment, obtaining collateral, and aftercare planning. Patient contracted for safety and was medication compliant on the unit. She was initially trialed on sertraline then switched to lexapro due to nausea from sertraline, with good effect with lexapro. Patient was encouraged to attend groups to have referral placed to wellness  circle. Patient did not consistently attend groups and indicated wishing to stay until she receives her paycheck. Given that she denied SI/HI/AVH and was psychiatrically stable, team explained to her that she did not meet criteria for further inpatient level of care and that it was inappropriate use of psychiatric bed for a housing issue. Patient was provided with list of shelters.     Suicidal and Homicidal Status on Discharge:   Patient denies suicidal and homicidal ideation, intent and plan.    Discharge Instructions:   Discharge Disposition: Homeless Shelter    Discharge Instructions given to: Patient    Questions that may arise between hospital discharge and your first follow-up appointment should be directed to [facility] Emergency Services: [phone]; 911 or the closest emergency department    Case discussion was held between inpatient treatment team and outpatient provider(s).  Discharge Plan:     Follow-up Information     Pcp, None, MD .                 Please see social work documentation for further details  Attestation:   The patient has been seen and evaluated by me,  Edwyna Shell, MD    Patient discharged on more than 1 antipsychotic medication? No  On discharge, was the patient on any psychotropic medication off label? No  I spent more than 30 minutes coordinating the discharge and reviewing the discharge plan.    Discharge Medications:     Tobacco Cessation Discharge Prescription for Cessation Medication  Patient was assessed on admission and found to be a tobacco user.  Tobacco cessation medication was not prescribed because  patient declined.       Discharge Medication List      Taking    escitalopram 10 MG tablet  Dose: 5 mg  Commonly known as: LEXAPRO  For: Major Depressive Disorder  Start taking on: July 25, 2020  Take 0.5 tablets (5 mg total) by mouth daily              Signed by: Edwyna Shell, MD   07/24/2020  2:11 PM

## 2020-07-24 NOTE — Discharge Instr - Diet (Signed)
Regular

## 2020-07-24 NOTE — Discharge Instr - Activity (Signed)
As tolerated

## 2020-07-25 ENCOUNTER — Emergency Department: Payer: 344

## 2020-07-25 ENCOUNTER — Telehealth (HOSPITAL_BASED_OUTPATIENT_CLINIC_OR_DEPARTMENT_OTHER): Payer: Self-pay

## 2020-07-25 ENCOUNTER — Emergency Department
Admission: EM | Admit: 2020-07-25 | Discharge: 2020-07-25 | Disposition: A | Discharge status: Home / Self Care | Payer: 344 | Attending: Emergency Medicine | Admitting: Emergency Medicine

## 2020-07-25 DIAGNOSIS — F32A Depression, unspecified: Secondary | ICD-10-CM

## 2020-07-25 LAB — ETHANOL: Alcohol: NOT DETECTED mg/dL

## 2020-07-25 LAB — CBC AND DIFFERENTIAL
Absolute NRBC: 0 10*3/uL (ref 0.00–0.00)
Basophils Absolute Automated: 0.04 10*3/uL (ref 0.00–0.08)
Basophils Automated: 0.4 %
Eosinophils Absolute Automated: 0.1 10*3/uL (ref 0.00–0.44)
Eosinophils Automated: 0.9 %
Hematocrit: 44 % — ABNORMAL HIGH (ref 34.7–43.7)
Hgb: 14.2 g/dL (ref 11.4–14.8)
Immature Granulocytes Absolute: 0.05 10*3/uL (ref 0.00–0.07)
Immature Granulocytes: 0.5 %
Lymphocytes Absolute Automated: 3.39 10*3/uL — ABNORMAL HIGH (ref 0.42–3.22)
Lymphocytes Automated: 30.9 %
MCH: 32.5 pg (ref 25.1–33.5)
MCHC: 32.3 g/dL (ref 31.5–35.8)
MCV: 100.7 fL — ABNORMAL HIGH (ref 78.0–96.0)
MPV: 11.1 fL (ref 8.9–12.5)
Monocytes Absolute Automated: 0.91 10*3/uL — ABNORMAL HIGH (ref 0.21–0.85)
Monocytes: 8.3 %
Neutrophils Absolute: 6.48 10*3/uL — ABNORMAL HIGH (ref 1.10–6.33)
Neutrophils: 59 %
Nucleated RBC: 0 /100 WBC (ref 0.0–0.0)
Platelets: 133 10*3/uL — ABNORMAL LOW (ref 142–346)
RBC: 4.37 10*6/uL (ref 3.90–5.10)
RDW: 13 % (ref 11–15)
WBC: 10.97 10*3/uL — ABNORMAL HIGH (ref 3.10–9.50)

## 2020-07-25 LAB — URINALYSIS REFLEX TO MICROSCOPIC EXAM - REFLEX TO CULTURE
Bilirubin, UA: NEGATIVE
Blood, UA: NEGATIVE
Glucose, UA: NEGATIVE
Leukocyte Esterase, UA: NEGATIVE
Nitrite, UA: NEGATIVE
Protein, UR: 30 — AB
Specific Gravity UA: 1.033 (ref 1.001–1.035)
Urine pH: 5 (ref 5.0–8.0)
Urobilinogen, UA: NEGATIVE mg/dL (ref 0.2–2.0)

## 2020-07-25 LAB — COMPREHENSIVE METABOLIC PANEL
ALT: 17 U/L (ref 0–55)
AST (SGOT): 17 U/L (ref 5–34)
Albumin/Globulin Ratio: 1.2 (ref 0.9–2.2)
Albumin: 3.8 g/dL (ref 3.5–5.0)
Alkaline Phosphatase: 90 U/L (ref 37–106)
Anion Gap: 10 (ref 5.0–15.0)
BUN: 27 mg/dL — ABNORMAL HIGH (ref 7.0–19.0)
Bilirubin, Total: 0.7 mg/dL (ref 0.2–1.2)
CO2: 26 mEq/L (ref 22–29)
Calcium: 9.1 mg/dL (ref 8.5–10.5)
Chloride: 105 mEq/L (ref 100–111)
Creatinine: 0.7 mg/dL (ref 0.6–1.0)
Globulin: 3.2 g/dL (ref 2.0–3.6)
Glucose: 127 mg/dL — ABNORMAL HIGH (ref 70–100)
Potassium: 3.9 mEq/L (ref 3.5–5.1)
Protein, Total: 7 g/dL (ref 6.0–8.3)
Sodium: 141 mEq/L (ref 136–145)

## 2020-07-25 LAB — RAPID DRUG SCREEN, URINE
Barbiturate Screen, UR: NEGATIVE
Benzodiazepine Screen, UR: NEGATIVE
Cannabinoid Screen, UR: NEGATIVE
Cocaine, UR: NEGATIVE
Opiate Screen, UR: NEGATIVE
PCP Screen, UR: NEGATIVE
Urine Amphetamine Screen: NEGATIVE

## 2020-07-25 LAB — CELL MORPHOLOGY: Cell Morphology: NORMAL

## 2020-07-25 LAB — SALICYLATE LEVEL: Salicylate Level: <5 mg/dL — ABNORMAL LOW (ref 15.0–30.0)

## 2020-07-25 LAB — LIPASE: Lipase: 18 U/L (ref 8–78)

## 2020-07-25 LAB — COVID-19 (SARS-COV-2): SARS CoV 2 Overall Result: NEGATIVE

## 2020-07-25 LAB — HEMOLYSIS INDEX: Hemolysis Index: 5 (ref 0–18)

## 2020-07-25 LAB — GFR: EGFR: >60

## 2020-07-25 LAB — ACETAMINOPHEN LEVEL: Acetaminophen Level: <7 ug/mL — ABNORMAL LOW (ref 10–30)

## 2020-07-25 LAB — HCG QUANTITATIVE: hCG, Quant.: 2.3

## 2020-07-25 MED ORDER — IOHEXOL 350 MG/ML IV SOLN
100.0000 mL | Freq: Once | INTRAVENOUS | Status: AC | PRN
Start: 2020-07-25 — End: 2020-07-25
  Administered 2020-07-25: 100 mL via INTRAVENOUS

## 2020-07-25 NOTE — ED Notes (Signed)
RN called case management 2x to arrange ride for pt to shelter. Per CN, unable to give cab voucher at this time.

## 2020-07-25 NOTE — ED Notes (Signed)
Emergency Department Continuation of Care Note:       Date of service: 07/25/20   Patient Name: [name]  MRN: [id number]  DOB: 03/26/1961    After discussion of patient's history, presentation, pertinent exam and result findings, I have assumed care of patient from [facility].       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       Past Surgical History:   Procedure Laterality Date    LEG SURGERY Left     Plate placed lower extremmity.       History reviewed. No pertinent family history.       Social Connections:     Frequency of Communication with Friends and Family:     Frequency of Social Gatherings with Friends and Family:     Attends Religious Services:     Active Member of Clubs or Organizations:     Attends Banker Meetings:     Marital Status:        No Known Allergies        RESULTS:    Lab Results:     Results     Procedure Component Value Units Date/Time    Cell MorpHology [[id number]]  (Abnormal) Collected: 07/25/20 0357     Updated: 07/25/20 0522     Cell Morphology Normal     Platelet Clumps Present    CBC and differential [[id number]]  (Abnormal) Collected: 07/25/20 0357    Specimen: Blood Updated: 07/25/20 0521     WBC 10.97 x10 3/uL      Hgb 14.2 g/dL      Hematocrit 10.1 %      Platelets 133 x10 3/uL      RBC 4.37 x10 6/uL      MCV 100.7 fL      MCH 32.5 pg      MCHC 32.3 g/dL      RDW 13 %      MPV 11.1 fL      Neutrophils 59.0 %      Lymphocytes Automated 30.9 %      Monocytes 8.3 %      Eosinophils Automated 0.9 %      Basophils Automated 0.4 %      Immature Granulocytes 0.5 %      Nucleated RBC 0.0 /100 WBC      Neutrophils Absolute 6.48 x10 3/uL      Lymphocytes Absolute Automated 3.39 x10 3/uL      Monocytes Absolute Automated 0.91 x10 3/uL      Eosinophils Absolute Automated 0.10 x10 3/uL       Basophils Absolute Automated 0.04 x10 3/uL      Immature Granulocytes Absolute 0.05 x10 3/uL      Absolute NRBC 0.00 x10 3/uL     Urinalysis Reflex to Microscopic Exam- Reflex to Culture [[id number]]  (Abnormal) Collected: 07/25/20 0357     Updated: 07/25/20 0514     Urine Type Urine, Clean Ca     Color, UA Yellow     Clarity, UA Hazy     Specific Gravity UA 1.033     Urine pH 5.0     Leukocyte Esterase, UA Negative     Nitrite, UA Negative  Protein, UR 30     Glucose, UA Negative     Ketones UA Trace     Urobilinogen, UA Negative mg/dL      Bilirubin, UA Negative     Blood, UA Negative     RBC, UA 3 - 5 /hpf      WBC, UA 0 - 5 /hpf      Squamous Epithelial Cells, Urine 6 - 10 /hpf      Hyaline Casts, UA 0 - 3 /lpf      Urine Mucus Present    Rapid drug screen, urine [[id number]] Collected: 07/25/20 0357    Specimen: Urine Updated: 07/25/20 0503     Urine Amphetamine Screen Negative     Barbiturate Screen, UR Negative     Benzodiazepine Screen, UR Negative     Cannabinoid Screen, UR Negative     Cocaine, UR Negative     Opiate Screen, UR Negative     PCP Screen, UR Negative    Beta HCG, Quant, Serum [[id number]] Collected: 07/25/20 0357     Updated: 07/25/20 0438     hCG, Quant. 2.3    Hemolysis index [[id number]] Collected: 07/25/20 0357     Updated: 07/25/20 0428     Hemolysis Index 5    GFR [[id number]] Collected: 07/25/20 0357     Updated: 07/25/20 0428     EGFR >60.0    Comprehensive metabolic panel [[id number]]  (Abnormal) Collected: 07/25/20 0357    Specimen: Blood Updated: 07/25/20 0428     Glucose 127 mg/dL      BUN 74.2 mg/dL      Creatinine 0.7 mg/dL      Sodium 595 mEq/L      Potassium 3.9 mEq/L      Chloride 105 mEq/L      CO2 26 mEq/L      Calcium 9.1 mg/dL      Protein, Total 7.0 g/dL      Albumin 3.8 g/dL      AST (SGOT) 17 U/L      ALT 17 U/L      Alkaline Phosphatase 90 U/L      Bilirubin, Total 0.7 mg/dL      Globulin 3.2 g/dL      Albumin/Globulin Ratio 1.2     Anion Gap 10.0    Ethanol (Alcohol)  Level [[id number]] Collected: 07/25/20 0357    Specimen: Blood Updated: 07/25/20 0428     Alcohol None Detected mg/dL     Acetaminophen level [[id number]]  (Abnormal) Collected: 07/25/20 0357    Specimen: Blood Updated: 07/25/20 0428     Acetaminophen Level <7 ug/mL     Salicylate level [[id number]]  (Abnormal) Collected: 07/25/20 0357    Specimen: Blood Updated: 07/25/20 0428     Salicylate Level <5.0 mg/dL     Lipase [[id number]] Collected: 07/25/20 0357    Specimen: Blood Updated: 07/25/20 0428     Lipase 18 U/L     COVID-19 (SARS-COV-2) ([facility])- Behavioral health admission (no isolation) [[id number]] Collected: 07/25/20 0357    Specimen: Nasopharyngeal Swab from Nasopharynx Updated: 07/25/20 0426     Purpose of COVID testing Screening     SARS-CoV-2 Specimen Source Nasopharyngeal     SARS CoV 2 Overall Result Negative    Narrative:      o Collect and clearly label specimen type:  o Upper respiratory specimen: One Nasopharyngeal Dry Swab NO  Transport Media.  o Hand deliver to laboratory ASAP  Indication for testing->Behavioral health admission  Screening             Radiology Results:     Thoracic Spine AP and Lateral    Result Date: 07/25/2020  No suspicious process. Adaline Sill, MD  07/25/2020 3:41 AM    Lumbar Spine 4+ Views    Result Date: 07/25/2020  No suspicious process. Adaline Sill, MD  07/25/2020 3:41 AM    CT head without contrast    Result Date: 07/25/2020   No acute intracranial abnormality. Recommendation: None. Adaline Sill, MD  07/25/2020 5:11 AM    CT Abd/Pelvis with IV Contrast only    Result Date: 07/25/2020  No acute abdominopelvic process. Recommendation: None. Adaline Sill, MD  07/25/2020 5:15 AM         VITALS:   Vitals:    07/25/20 1610 07/25/20 0601 07/25/20 0830 07/25/20 1000   BP: 126/76 112/73  124/68   Pulse: 69 64 63 66   Resp:   16 16   Temp:       TempSrc:       SpO2: 91% 98% 95% 95%        Cardiac Monitor:   HR normal at 75 bpm.   Respirations normal at 17 breaths/minute.    Oxygenation normal at 97% on RA.       Old Medical Records:  Reviewed all pertinent previous records, results, and nursing notes.       ED Course/Medical Decision Making:     Patient has been evaluated by psychiatry, and resources were provided.  They do not feel as though she needs acute inpatient treatment, and I am in agreement.  She has no complaints of suicidality or homicidality, is not having any hallucinations or delusions, is well kempt, and clearly able to care for self.  She also reports that she is homeless and has nowhere to go.  We had her case management team contact patient and she has been provided with shelter resources.  She remains hemodynamically stable.  Safe and stable for discharge.  Strict return precautions discussed.  Patient understands and agrees with care plan.      Procedures      Dispo:   ED Disposition     ED Disposition Condition Date/Time Comment    Discharge  Tue Jul 25, 2020 11:02 AM [name] discharge to home/self care.    Condition at disposition: Stable        Diagnosis:     ICD-10-CM    1. Depression, unspecified depression type  F32.9       Condition: stable  Follow-up:     Multidisciplinary Visit: Provider(s) to Follow Up With            [facility]   Specialty: Emergency Medicine    [address]  [place] [address]   Phone: [phone]       Next Steps: Follow up    Instructions: If symptoms worsen    Your primary care doctor        Next Steps: Schedule an appointment as soon as possible for a visit              MEDS:     Orders Placed This Encounter   Medications    iohexol (OMNIPAQUE) 350 MG/ML injection 100 mL         Ana[place], [place]   Home Medication Instructions [id number]    Printed on:07/25/20 0649   Medication Information  escitalopram (LEXAPRO) 10 MG tablet  Take 0.5 tablets (5 mg total) by mouth daily                          Estell Puccini A, DO  07/25/20 1106
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
## 2020-07-25 NOTE — ED Notes (Signed)
Spoke with Orlie Pollen at psych liaison regarding discharge status and pt resources. Per Orlie Pollen, previous psych liaison offered pt resources and pt is cleared for discharge.

## 2020-07-25 NOTE — ED Notes (Signed)
Called PTS to request wheelchair Zenaida Niece for [facility] to [facility] [address] [place], [place]

## 2020-07-25 NOTE — ED Notes (Signed)
RN called case management to arrange pt ride to shelter, spoke with Delice Bison.

## 2020-07-25 NOTE — ED Notes (Signed)
RN's first interaction with pt. Pt denies SI, states she needs resources, somewhere to stay and sleep while she "figures everything out."

## 2020-07-25 NOTE — Discharge Instructions (Signed)
Depression    After evaluation, your doctor feels that you are suffering from depression.    Depression is an illness that can make you feel guilty or worthless. It can make it hard to concentrate or make you lose interest in things you usually enjoy. It may also affect your ability to eat, sleep, and function like normal. Depression is caused by a chemical imbalance in the brain that affects mood. Depression can be treated!   The diagnosis of depression is given when these symptoms are present every day for at least 2 weeks.    It is important to follow up with a counselor as well as your family doctor. If you do not have an appointment in the next 2 to 3 days, call and make one. It is VERY IMPORTANT that your counselor and family doctor know that your problems are getting worse.    It is important to have a counselor and a family doctor who see you on a regular basis. They can help you with your problem, keep a close eye on you and follow your progress. You have been given a list of phone numbers to call if you feel that you need to talk to someone before your regular appointment.    Drinking alcohol can make your depression worse. Do not drink alcohol while taking antidepressant medication.    YOU SHOULD SEEK MEDICAL ATTENTION IMMEDIATELY, EITHER HERE OR AT THE NEAREST EMERGENCY DEPARTMENT, IF ANY OF THE FOLLOWING OCCURS:   You do not feel safe in your home environment.   You think of harming yourself or suicide.   You think of harming others.   You become worse and feel that you cannot wait until your follow-up appointment for treatment.

## 2020-07-25 NOTE — ED Notes (Signed)
RN provided pt with resources per psych (see media in Chart Review). Case Management Alina to come speak with pt regarding additional resources.
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
## 2020-07-27 ENCOUNTER — Ambulatory Visit (INDEPENDENT_AMBULATORY_CARE_PROVIDER_SITE_OTHER): Payer: Self-pay | Admitting: Nurse Practitioner

## 2020-07-27 ENCOUNTER — Telehealth (HOSPITAL_BASED_OUTPATIENT_CLINIC_OR_DEPARTMENT_OTHER): Payer: Self-pay

## 2020-07-27 NOTE — Telephone Encounter (Signed)
PCCM attempted to leave voicemail for Pt regarding  f/u on recent Emergency Department visit. Pt has a VM that has not been setup yet. PCCM is unable to leave voicemail. PCCM will make second attempt in 24 hrs.    Donivan Scull, [facility]    Behavioral Health Care Navigator

## 2020-07-31 ENCOUNTER — Telehealth (HOSPITAL_BASED_OUTPATIENT_CLINIC_OR_DEPARTMENT_OTHER): Payer: Self-pay

## 2020-07-31 NOTE — Telephone Encounter (Signed)
PCCM's second unsuccessful attempt at contacting Pt for f/u regarding recent Emergency Department visit. Pt still has a VM that has not been setup yet. PCCM is unable to leave voicemail.     Donivan Scull, [facility]    Behavioral Health Care Navigator

## 2020-09-12 ENCOUNTER — Emergency Department
Admission: EM | Admit: 2020-09-12 | Discharge: 2020-09-13 | Disposition: A | Discharge status: Home / Self Care | Payer: Self-pay | Attending: Emergency Medicine | Admitting: Emergency Medicine

## 2020-09-12 ENCOUNTER — Emergency Department: Payer: Self-pay

## 2020-09-12 DIAGNOSIS — Z8673 Personal history of transient ischemic attack (TIA), and cerebral infarction without residual deficits: Secondary | ICD-10-CM | POA: Insufficient documentation

## 2020-09-12 DIAGNOSIS — R002 Palpitations: Secondary | ICD-10-CM | POA: Insufficient documentation

## 2020-09-12 DIAGNOSIS — R079 Chest pain, unspecified: Secondary | ICD-10-CM | POA: Insufficient documentation

## 2020-09-12 LAB — COMPREHENSIVE METABOLIC PANEL
ALT: 25 U/L (ref 0–55)
AST (SGOT): 25 U/L (ref 5–34)
Albumin/Globulin Ratio: 1.1 (ref 0.9–2.2)
Albumin: 3.5 g/dL (ref 3.5–5.0)
Alkaline Phosphatase: 100 U/L (ref 37–106)
Anion Gap: 14 (ref 5.0–15.0)
BUN: 23 mg/dL — ABNORMAL HIGH (ref 7.0–19.0)
Bilirubin, Total: 0.2 mg/dL (ref 0.2–1.2)
CO2: 23 mEq/L (ref 22–29)
Calcium: 9.1 mg/dL (ref 8.5–10.5)
Chloride: 105 mEq/L (ref 100–111)
Creatinine: 0.8 mg/dL (ref 0.6–1.0)
Globulin: 3.2 g/dL (ref 2.0–3.6)
Glucose: 146 mg/dL — ABNORMAL HIGH (ref 70–100)
Potassium: 4.2 mEq/L (ref 3.5–5.1)
Protein, Total: 6.7 g/dL (ref 6.0–8.3)
Sodium: 142 mEq/L (ref 136–145)

## 2020-09-12 LAB — HEMOLYSIS INDEX: Hemolysis Index: 10 (ref 0–18)

## 2020-09-12 LAB — CBC AND DIFFERENTIAL
Absolute NRBC: 0 10*3/uL (ref 0.00–0.00)
Basophils Absolute Automated: 0.06 10*3/uL (ref 0.00–0.08)
Basophils Automated: 0.6 %
Eosinophils Absolute Automated: 0.19 10*3/uL (ref 0.00–0.44)
Eosinophils Automated: 1.8 %
Hematocrit: 41.3 % (ref 34.7–43.7)
Hgb: 13.3 g/dL (ref 11.4–14.8)
Immature Granulocytes Absolute: 0.03 10*3/uL (ref 0.00–0.07)
Immature Granulocytes: 0.3 %
Lymphocytes Absolute Automated: 3.81 10*3/uL — ABNORMAL HIGH (ref 0.42–3.22)
Lymphocytes Automated: 36.1 %
MCH: 32.6 pg (ref 25.1–33.5)
MCHC: 32.2 g/dL (ref 31.5–35.8)
MCV: 101.2 fL — ABNORMAL HIGH (ref 78.0–96.0)
MPV: 9.7 fL (ref 8.9–12.5)
Monocytes Absolute Automated: 0.84 10*3/uL (ref 0.21–0.85)
Monocytes: 8 %
Neutrophils Absolute: 5.61 10*3/uL (ref 1.10–6.33)
Neutrophils: 53.2 %
Nucleated RBC: 0 /100 WBC (ref 0.0–0.0)
Platelets: 199 10*3/uL (ref 142–346)
RBC: 4.08 10*6/uL (ref 3.90–5.10)
RDW: 13 % (ref 11–15)
WBC: 10.54 10*3/uL — ABNORMAL HIGH (ref 3.10–9.50)

## 2020-09-12 LAB — TROPONIN I
Troponin I: <0.01 ng/mL (ref 0.00–0.05)
Troponin I: <0.01 ng/mL (ref 0.00–0.05)

## 2020-09-12 LAB — URINALYSIS REFLEX TO MICROSCOPIC EXAM - REFLEX TO CULTURE
Bilirubin, UA: NEGATIVE
Blood, UA: NEGATIVE
Glucose, UA: NEGATIVE
Ketones UA: NEGATIVE
Leukocyte Esterase, UA: NEGATIVE
Nitrite, UA: NEGATIVE
Protein, UR: NEGATIVE
Specific Gravity UA: 1.029 (ref 1.001–1.035)
Urine pH: 5 (ref 5.0–8.0)
Urobilinogen, UA: NEGATIVE mg/dL (ref 0.2–2.0)

## 2020-09-12 LAB — URINE BHCG POC: Urine bHCG POC: NEGATIVE

## 2020-09-12 LAB — LIPASE: Lipase: 33 U/L (ref 8–78)

## 2020-09-12 LAB — GFR: EGFR: >60

## 2020-09-12 MED ORDER — LIDOCAINE VISCOUS HCL 2 % MT SOLN
10.0000 mL | Freq: Once | OROMUCOSAL | Status: AC
Start: 2020-09-12 — End: 2020-09-12
  Administered 2020-09-12: 23:00:00 10 mL via OROMUCOSAL
  Filled 2020-09-12: qty 15

## 2020-09-12 MED ORDER — ASPIRIN 325 MG PO TABS
325.0000 mg | ORAL_TABLET | Freq: Once | ORAL | Status: AC
Start: 2020-09-12 — End: 2020-09-12
  Administered 2020-09-12: 23:00:00 325 mg via ORAL
  Filled 2020-09-12: qty 1

## 2020-09-12 MED ORDER — ALUM & MAG HYDROXIDE-SIMETH 200-200-20 MG/5ML PO SUSP
30.0000 mL | Freq: Once | ORAL | Status: AC
Start: 2020-09-12 — End: 2020-09-12
  Administered 2020-09-12: 23:00:00 30 mL via ORAL
  Filled 2020-09-12: qty 30

## 2020-09-12 MED ORDER — FAMOTIDINE 20 MG PO TABS
20.0000 mg | ORAL_TABLET | Freq: Once | ORAL | Status: AC
Start: 2020-09-12 — End: 2020-09-12
  Administered 2020-09-12: 23:00:00 20 mg via ORAL
  Filled 2020-09-12: qty 1

## 2020-09-12 NOTE — ED Notes (Signed)
Bed: PU40  Expected date:   Expected time:   Means of arrival:   Comments:
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
## 2020-09-12 NOTE — ED Triage Notes (Signed)
65yr F BIBA for sudden unset chest pain and shortness of breath after eating dinner.

## 2020-09-15 LAB — ECG 12-LEAD
Atrial Rate: 77 {beats}/min
P Axis: 22 degrees
P-R Interval: 134 ms
Q-T Interval: 358 ms
QRS Duration: 70 ms
QTC Calculation (Bezet): 405 ms
R Axis: 16 degrees
T Axis: 35 degrees
Ventricular Rate: 77 {beats}/min

## 2020-09-22 ENCOUNTER — Emergency Department
Admission: EM | Admit: 2020-09-22 | Discharge: 2020-09-22 | Disposition: A | Discharge status: Home / Self Care | Payer: Self-pay | Attending: Emergency Medicine | Admitting: Emergency Medicine

## 2020-09-22 ENCOUNTER — Emergency Department: Payer: Self-pay

## 2020-09-22 DIAGNOSIS — R109 Unspecified abdominal pain: Secondary | ICD-10-CM | POA: Insufficient documentation

## 2020-09-22 DIAGNOSIS — K429 Umbilical hernia without obstruction or gangrene: Secondary | ICD-10-CM | POA: Insufficient documentation

## 2020-09-22 DIAGNOSIS — Z3202 Encounter for pregnancy test, result negative: Secondary | ICD-10-CM | POA: Insufficient documentation

## 2020-09-22 DIAGNOSIS — M5136 Other intervertebral disc degeneration, lumbar region: Secondary | ICD-10-CM | POA: Insufficient documentation

## 2020-09-22 LAB — URINALYSIS REFLEX TO MICROSCOPIC EXAM - REFLEX TO CULTURE
Bilirubin, UA: NEGATIVE
Blood, UA: NEGATIVE
Glucose, UA: NEGATIVE
Ketones UA: NEGATIVE
Leukocyte Esterase, UA: NEGATIVE
Nitrite, UA: NEGATIVE
Protein, UR: 30 — AB
Specific Gravity UA: 1.025 (ref 1.001–1.035)
Urine pH: 6 (ref 5.0–8.0)
Urobilinogen, UA: NEGATIVE mg/dL (ref 0.2–2.0)

## 2020-09-22 LAB — CBC AND DIFFERENTIAL
Absolute NRBC: 0 10*3/uL (ref 0.00–0.00)
Basophils Absolute Automated: 0.05 10*3/uL (ref 0.00–0.08)
Basophils Automated: 0.6 %
Eosinophils Absolute Automated: 0.15 10*3/uL (ref 0.00–0.44)
Eosinophils Automated: 1.8 %
Hematocrit: 43.2 % (ref 34.7–43.7)
Hgb: 14 g/dL (ref 11.4–14.8)
Immature Granulocytes Absolute: 0.02 10*3/uL (ref 0.00–0.07)
Immature Granulocytes: 0.2 %
Lymphocytes Absolute Automated: 2.72 10*3/uL (ref 0.42–3.22)
Lymphocytes Automated: 32.3 %
MCH: 32.3 pg (ref 25.1–33.5)
MCHC: 32.4 g/dL (ref 31.5–35.8)
MCV: 99.8 fL — ABNORMAL HIGH (ref 78.0–96.0)
MPV: 9.8 fL (ref 8.9–12.5)
Monocytes Absolute Automated: 0.63 10*3/uL (ref 0.21–0.85)
Monocytes: 7.5 %
Neutrophils Absolute: 4.84 10*3/uL (ref 1.10–6.33)
Neutrophils: 57.6 %
Nucleated RBC: 0 /100 WBC (ref 0.0–0.0)
Platelets: 210 10*3/uL (ref 142–346)
RBC: 4.33 10*6/uL (ref 3.90–5.10)
RDW: 12 % (ref 11–15)
WBC: 8.41 10*3/uL (ref 3.10–9.50)

## 2020-09-22 LAB — LIPASE: Lipase: 34 U/L (ref 8–78)

## 2020-09-22 LAB — COMPREHENSIVE METABOLIC PANEL
ALT: 28 U/L (ref 0–55)
AST (SGOT): 22 U/L (ref 5–34)
Albumin/Globulin Ratio: 1.2 (ref 0.9–2.2)
Albumin: 3.9 g/dL (ref 3.5–5.0)
Alkaline Phosphatase: 116 U/L — ABNORMAL HIGH (ref 37–106)
Anion Gap: 14 (ref 5.0–15.0)
BUN: 18 mg/dL (ref 7–19)
Bilirubin, Total: 0.3 mg/dL (ref 0.2–1.2)
CO2: 26 mEq/L (ref 22–29)
Calcium: 9.1 mg/dL (ref 8.5–10.5)
Chloride: 106 mEq/L (ref 100–111)
Creatinine: 0.7 mg/dL (ref 0.6–1.0)
Globulin: 3.3 g/dL (ref 2.0–3.6)
Glucose: 117 mg/dL — ABNORMAL HIGH (ref 70–100)
Potassium: 4.8 mEq/L (ref 3.5–5.1)
Protein, Total: 7.2 g/dL (ref 6.0–8.3)
Sodium: 146 mEq/L — ABNORMAL HIGH (ref 136–145)

## 2020-09-22 LAB — HCG, SERUM, QUALITATIVE: Hcg Qualitative: NEGATIVE

## 2020-09-22 LAB — GFR: EGFR: >60

## 2020-09-22 MED ORDER — DICYCLOMINE HCL 10 MG PO CAPS
10.0000 mg | ORAL_CAPSULE | Freq: Four times a day (QID) | ORAL | 0 refills | Status: DC | PRN
Start: 2020-09-22 — End: 2021-11-04

## 2020-09-22 MED ORDER — IOHEXOL 350 MG/ML IV SOLN
100.0000 mL | Freq: Once | INTRAVENOUS | Status: AC | PRN
Start: 2020-09-22 — End: 2020-09-22
  Administered 2020-09-22: 100 mL via INTRAVENOUS

## 2020-09-22 MED ORDER — DICYCLOMINE HCL 10 MG PO CAPS
10.0000 mg | ORAL_CAPSULE | Freq: Once | ORAL | Status: AC
Start: 2020-09-22 — End: 2020-09-22
  Administered 2020-09-22: 18:00:00 10 mg via ORAL
  Filled 2020-09-22: qty 1

## 2020-09-22 NOTE — Discharge Instructions (Signed)
Take medication as prescribed, take tylenol for added pain relief. Rest, keep hydrated.  Very important to follow-up with a primary care within the next week - we listed a contact, you do not need insurance to see them, call them tomorrow.  Very important to follow-up with Dr. Maebelle Munroe psychiatrist.  Return to the ER with any worsening of condition or concerns.

## 2020-09-22 NOTE — EDIE (Signed)
COLLECTIVE?NOTIFICATION?09/22/2020 14:39?VERDIS, BASSETTE L?MRN: [id number]    [facility] [facility]'s patient encounter information:   VWU:?[id number]  Account [id number]  Billing Account [id number]      Criteria Met      5 ED Visits in 12 Months    Security and Safety  No recent Security Events currently on file    ED Care Guidelines  There are currently no ED Care Guidelines for this patient. Please check your facility's medical records system.    Flags      Negative COVID-19 Lab Result - [facility] - A specimen collected from this patient was negative for COVID-19 / Attributed By: [place] Department of Health / Attributed On: 09/15/2020       Prescription Monitoring Program  000??- Narcotic Use Score  000??- Sedative Use Score  000??- Stimulant Use Score  000??- Overdose Risk Score  - All Scores range from 000-999 with 75% of the population scoring < 200 and on 1% scoring above 650  - The last digit of the narcotic, sedative, and stimulant score indicates the number of active prescriptions of that type  - Higher Use scores correlate with increased prescribers, pharmacies, mg equiv, and overlapping prescriptions  - Higher Overdose Risk Scores correlate with increased risk of unintentional overdose death   Concerning or unexpectedly high scores should prompt a review of the PMP record; this does not constitute checking PMP for prescribing purposes.      E.D. Visit Count (12 mo.)  Facility Visits   [facility] 3   [facility] 2   Total 5   Note: Visits indicate total known visits.     Recent Emergency Department Visit Summary  Date Facility [facility] Type Diagnoses or Chief Complaint   Sep 22, 2020 [place] [name]      Abdominal Pain      Sep 12, 2020 [place] [facility] [place] [name]      Chest Pain      Palpitations      Chest pain, unspecified      Jul 25, 2020 [place] [facility] [place] [name]      numerous issues       Psychiatric Evaluation      Major depressive disorder, single episode, unspecified      Jul 17, 2020 [place] [name] [place]      medic.      Depression      Back Pain      Major depressive disorder, single episode, unspecified      Jul 07, 2020 [place] [name]      body pain      Generalized weakness      Abdominal Pain      Chest Pain      Contact with and (suspected) exposure to covid-19      Other specified diseases of biliary tract      Generalized abdominal pain      Nontoxic single thyroid nodule          Recent Inpatient Visit Summary  No recorded inpatient visits.     Care Team  There is not a care team on record at this time.   Collective Portal  This patient has registered at the [facility] Emergency Department   For more information visit: [email or web address]     PLEASE NOTE:  1.   Any care recommendations and other clinical information are provided as guidelines or for historical purposes only, and providers should exercise their own clinical judgment when providing care.    2.   You may only use this information for purposes of treatment, payment or health care operations activities, and subject to the limitations of applicable [employer] Polic[employer].    3.   You should consult directly with the organization that provided a care guideline or other clinical history with any questions about additional information or accuracy or completeness of information provided.    ? 2021 [employer] - www.[employer]medical.com

## 2020-09-24 NOTE — ED Provider Notes (Signed)
EMERGENCY DEPARTMENT NOTE     ED PHYSICIAN ASSIGNED     None          ED MIDLEVEL (APP) ASSIGNED     Date/Time Event User Comments    09/22/20 1711 PA/NP Provider Assigned Melburn Popper, Rozell Theiler P. Toma Aran, FNP assigned as Nurse Practitioner          HISTORY OF PRESENT ILLNESS   Historian:Patient  Translator Used: No    Chief Complaint: Abdominal Pain     Mechanism of Injury:       59 y.o. female with history of sadness not depression per patient and other significant history listed below presents for mid abdomen and suprapubic cramping since 12/2019 and 20 pound weight gain since 12/2019; pain worse past couple of weeks. Denies fever, N/V/D. Reports is concerned for either pregnancy or tumor, has appointment with psychiatrist/Dr. Kerry Hough today and was told she won't be seen again unless you figures out her abdominal pain (whether pregnant or tumor), indicates has not been taking psych meds as recommended because of possible pregnancy or tumor.   Was see in ER 10 days ago for chest pain, has had CT ab/pelvis done 07/2020.  Patient indicates weight gain not related to depression because she's having sadness.  Reports having primary care appointment yesterday that she missed due to communication error.    1. Location of symptoms: See above  2. Onset of symptoms: 12/2019  3. What was patient doing when symptoms started (Context): see above  4. Severity: moderate  5. Timing: worsening  6. Activities that worsen symptoms: uncertain  7. Activities that improve symptoms: uncertain  8. Quality: cramping  9. Radiation of symptoms: no  10. Associated signs and Symptoms: see above  11. Are symptoms worsening? 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       Past Surgical History:  Past Surgical History:   Procedure  Laterality Date    LEG SURGERY Left     Plate placed lower extremmity.       Social History:  Social History     Socioeconomic History    Marital status: Significant Other     Spouse name: Not on file    Number of children: Not on file    Years of education: Not on file    Highest education level: Not on file   Occupational History    Not on file   Tobacco Use    Smoking status: Never Smoker    Smokeless tobacco: Never Used    Tobacco comment: no use of cigarettes in the past 30 days    Vaping Use    Vaping Use: Never used   Substance and Sexual Activity    Alcohol use: Yes     Comment: celebratory, No alcohol use in the past 12 months     Drug use: Never     Comment: no use of substance in the past 30 days     Sexual activity: Not Currently   Other Topics Concern    Not on file   Social History Narrative    Not on file     Social Determinants of Health     Financial  Resource Strain:     Difficulty of Paying Living Expenses:    Food Insecurity:     Worried About Programme researcher, broadcasting/film/video in the Last Year:     Barista in the Last Year:    Transportation Needs:     Freight forwarder (Medical):     Lack of Transportation (Non-Medical):    Physical Activity:     Days of Exercise per Week:     Minutes of Exercise per Session:    Stress:     Feeling of Stress :    Social Connections:     Frequency of Communication with Friends and Family:     Frequency of Social Gatherings with Friends and Family:     Attends Religious Services:     Active Member of Clubs or Organizations:     Attends Engineer, structural:     Marital Status:    Intimate Partner Violence:     Fear of Current or Ex-Partner:     Emotionally Abused:     Physically Abused:     Sexually Abused:        Family History:  History reviewed. No pertinent family history.    Outpatient Medication:  Discharge Medication List as of 09/22/2020  6:53 PM            REVIEW OF SYSTEMS   Review of Systems   Gastrointestinal:  Positive for abdominal pain.   All other systems reviewed and are negative.         PHYSICAL EXAM     ED Triage Vitals [09/22/20 1456]   Enc Vitals Group      BP 104/70      Heart Rate 77      Resp Rate 18      Temp 98.5 F (36.9 C)      Temp Source Oral      SpO2 97 %      Weight 112 kg      Height 1.626 m      Head Circumference       Peak Flow       Pain Score       Pain Loc       Pain Edu?       Excl. in GC?      Physical Exam  Vitals and nursing note reviewed.   Constitutional:       General: She is not in acute distress.     Appearance: Normal appearance. She is well-developed. She is obese. She is not ill-appearing, toxic-appearing or diaphoretic.   HENT:      Head: Normocephalic and atraumatic.      Nose: Nose normal.      Mouth/Throat:      Pharynx: Oropharynx is clear.   Eyes:      General: No scleral icterus.        Right eye: No discharge.         Left eye: No discharge.      Conjunctiva/sclera: Conjunctivae normal.   Cardiovascular:      Rate and Rhythm: Normal rate and regular rhythm.      Pulses: Normal pulses.   Pulmonary:      Effort: Pulmonary effort is normal. No respiratory distress.   Abdominal:      General: Abdomen is protuberant. There is no distension.      Palpations: There is no shifting dullness, fluid wave or mass.      Tenderness: There  is generalized abdominal tenderness. There is no guarding or rebound. Negative signs include Murphy's sign and McBurney's sign.   Musculoskeletal:         General: No swelling, tenderness or signs of injury. Normal range of motion.      Cervical back: Normal range of motion and neck supple. No rigidity.   Skin:     General: Skin is warm and dry.      Capillary Refill: Capillary refill takes less than 2 seconds.      Coloration: Skin is not jaundiced or pale.      Findings: No bruising, erythema or rash.   Neurological:      General: No focal deficit present.      Mental Status: She is alert and oriented to person, place, and time.      Gait: Gait  normal.   Psychiatric:         Attention and Perception: Attention normal.         Mood and Affect: Mood and affect normal.         Behavior: Behavior is cooperative.         Thought Content: Thought content does not include homicidal or suicidal ideation.      Comments: Continuous speech, needing redirection.             MEDICAL DECISION MAKING     DISCUSSION    Mask, eye protection, gloves worn.  Medicate for discomfort.  CBC to evaluate for infectious processes; CMP to evaluate liver, kidneys, electrolytes; lipase; hcg.  CT to evaluate for tumor, obstruction, acute etiology.  Past charts reviewed, has history of psych admission and history of paranoia.     On re-evaluation in NAD, able to tolerate PO in ER.  Extensive discussion with patient regarding results and possible causes - patient indicates thought hernia (as seen in past as well) disappeared on its own, discussed hernia - no palpable hernia on exam, little concern for incarceration at this time.    Discussed medication, tylenol, rest, hydration, importance of follow-up with primary care/given contact, importance of follow-up with psychiatrist, when to return to the ER.    Prescription sent to pharmacy for abdominal pain/bentyl.    Patient indicates understanding and agreeable to discharge plan, patient feels safe going home.  AOx3, calm and cooperative.           The patient is NOT septic.  All labs and vital signs from the current visit have been reviewed and any abnormality that is present is not due to sepsis.    Vital Signs: Reviewed the patients vital signs.   Nursing Notes: Reviewed and utilized available nursing notes.  Medical Records Reviewed: Reviewed available past medical records.  Counseling: The emergency provider has spoken with the patient and discussed todays findings, in addition to providing specific details for the plan of care.  Questions are answered and there is agreement with the plan.          RADIOLOGY IMAGING STUDIES      CT  Abd/Pelvis with IV Contrast   Final Result      1. No bowel obstruction or acute inflammation. Appendix not visualized.   A few diverticuli are present without evidence of diverticulitis.   2. Small umbilical hernia containing fat only, unchanged.   3. Cholecystectomy. Common duct is prominent but unchanged when compared   to August 2021 consistent with postsurgical change   4. Other incidental findings as described above  Kinnie Feil, MD    09/22/2020 6:23 PM              PULSE OXIMETRY    Oxygen Saturation by Pulse Oximetry: 98%  Interventions: none  Interpretation:  WNL, independently interpreted by me.         EMERGENCY DEPT. MEDICATIONS      ED Medication Orders (From admission, onward)    Start Ordered     Status Ordering Provider    09/22/20 1759 09/22/20 1759  iohexol (OMNIPAQUE) 350 MG/ML injection 100 mL  IMG once as needed     Route: Intravenous  Ordered Dose: 100 mL     Last MAR action: Imaging Agent Given Toma Aran    09/22/20 1731 09/22/20 1730  dicyclomine (BENTYL) capsule 10 mg  Once     Route: Oral  Ordered Dose: 10 mg     Last MAR action: Given Asaf Elmquist P              LABORATORY RESULTS    Ordered and independently interpreted AVAILABLE laboratory tests. Please see results section in chart for full details.  Results for orders placed or performed during the hospital encounter of 09/22/20   CBC and differential   Result Value Ref Range    WBC 8.41 3.1 - 9.5 x10 3/uL    Hgb 14.0 11.4 - 14.8 g/dL    Hematocrit 16.1 09.6 - 43.7 %    Platelets 210 142 - 346 x10 3/uL    RBC 4.33 3.90 - 5.10 x10 6/uL    MCV 99.8 (H) 78.0 - 96.0 fL    MCH 32.3 25.1 - 33.5 pg    MCHC 32.4 31 - 35 g/dL    RDW 12 04.5 - 40.9 %    MPV 9.8 8.9 - 12.5 fL    Neutrophils 57.6 None %    Lymphocytes Automated 32.3 None %    Monocytes 7.5 None %    Eosinophils Automated 1.8 None %    Basophils Automated 0.6 None %    Immature Granulocytes 0.2 None %    Nucleated RBC 0.0 0.0 - 0.0 /100 WBC    Neutrophils  Absolute 4.84 1 - 6 x10 3/uL    Lymphocytes Absolute Automated 2.72 0.42 - 3.22 x10 3/uL    Monocytes Absolute Automated 0.63 0.21 - 0.85 x10 3/uL    Eosinophils Absolute Automated 0.15 0.00 - 0.44 x10 3/uL    Basophils Absolute Automated 0.05 0.00 - 0.08 x10 3/uL    Immature Granulocytes Absolute 0.02 0.00 - 0.07 x10 3/uL    Absolute NRBC 0.00 0.00 - 0.00 x10 3/uL   Comprehensive metabolic panel   Result Value Ref Range    Glucose 117 (H) 70 - 100 mg/dL    BUN 18 7 - 19 mg/dL    Creatinine 0.7 0.6 - 1.0 mg/dL    Sodium 811 (H) 914 - 145 mEq/L    Potassium 4.8 3.5 - 5.1 mEq/L    Chloride 106 100 - 111 mEq/L    CO2 26 22 - 29 mEq/L    Calcium 9.1 8.5 - 10.5 mg/dL    Protein, Total 7.2 6.0 - 8.3 g/dL    Albumin 3.9 3.5 - 5.0 g/dL    AST (SGOT) 22 5 - 34 U/L    ALT 28 0 - 55 U/L    Alkaline Phosphatase 116 (H) 37 - 106 U/L    Bilirubin, Total 0.3 0.2 - 1.2 mg/dL    Globulin 3.3  2.0 - 3.6 g/dL    Albumin/Globulin Ratio 1.2 0.9 - 2.2    Anion Gap 14.0 5 - 15   UA Reflex to Micro - Reflex to Culture   Result Value Ref Range    Urine Type Urine, Clean Ca     Color, UA Yellow Colorless - Yellow    Clarity, UA Sl Cloudy (A) Clear - Hazy    Specific Gravity UA 1.025 1.001 - 1.035    Urine pH 6.0 5.0 - 8.0    Leukocyte Esterase, UA Negative Negative    Nitrite, UA Negative Negative    Protein, UR 30 (A) Negative    Glucose, UA Negative Negative    Ketones UA Negative Negative    Urobilinogen, UA Negative 0.2 - 2.0 mg/dL    Bilirubin, UA Negative Negative    Blood, UA Negative Negative    RBC, UA 0 - 2 0 - 5 /hpf    Squamous Epithelial Cells, Urine 0 - 5 0 - 25 /hpf   GFR   Result Value Ref Range    EGFR >60.0    Lipase   Result Value Ref Range    Lipase 34 8 - 78 U/L   Beta HCG, Qual, Serum   Result Value Ref Range    Hcg Qualitative Negative Negative           CRITICAL CARE/PROCEDURES    Procedures  None        DIAGNOSIS      Diagnosis:  Final diagnoses:   Abdominal cramping   Negative pregnancy test   Umbilical hernia  without obstruction and without gangrene       Disposition:  ED Disposition     ED Disposition Condition Date/Time Comment    Discharge  Fri Sep 22, 2020  6:53 PM [name] discharge to home/self care.    Condition at disposition: Stable          Prescriptions:  Discharge Medication List as of 09/22/2020  6:53 PM      START taking these medications    Details   dicyclomine (BENTYL) 10 MG capsule Take 1 capsule (10 mg total) by mouth every 6 (six) hours as needed (abdominal pain), Starting Fri 09/22/2020, E-Rx                Toma Aran, FNP  09/24/20 2128

## 2021-10-29 ENCOUNTER — Emergency Department: Payer: Self-pay

## 2021-10-29 ENCOUNTER — Emergency Department
Admission: EM | Admit: 2021-10-29 | Discharge: 2021-10-30 | Disposition: A | Discharge status: Home / Self Care | Payer: Self-pay | Attending: Emergency Medicine | Admitting: Emergency Medicine

## 2021-10-29 DIAGNOSIS — G8929 Other chronic pain: Secondary | ICD-10-CM | POA: Insufficient documentation

## 2021-10-29 DIAGNOSIS — Z59819 Housing instability, housed unspecified: Secondary | ICD-10-CM | POA: Insufficient documentation

## 2021-10-29 DIAGNOSIS — Z59812 Housing instability, housed, homelessness in past 12 months: Secondary | ICD-10-CM

## 2021-10-29 DIAGNOSIS — Z139 Encounter for screening, unspecified: Secondary | ICD-10-CM

## 2021-10-29 DIAGNOSIS — M545 Low back pain, unspecified: Secondary | ICD-10-CM | POA: Insufficient documentation

## 2021-10-29 LAB — COMPREHENSIVE METABOLIC PANEL
ALT: 15 U/L (ref 0–55)
AST (SGOT): 17 U/L (ref 5–41)
Albumin/Globulin Ratio: 1.3 (ref 0.9–2.2)
Albumin: 4 g/dL (ref 3.5–5.0)
Alkaline Phosphatase: 106 U/L (ref 37–117)
Anion Gap: 9 (ref 5.0–15.0)
BUN: 20 mg/dL (ref 7.0–21.0)
Bilirubin, Total: 0.3 mg/dL (ref 0.2–1.2)
CO2: 26 mEq/L (ref 17–29)
Calcium: 9.2 mg/dL (ref 8.5–10.5)
Chloride: 108 mEq/L (ref 99–111)
Creatinine: 0.8 mg/dL (ref 0.4–1.0)
Globulin: 3.1 g/dL (ref 2.0–3.6)
Glucose: 156 mg/dL — ABNORMAL HIGH (ref 70–100)
Potassium: 4.4 mEq/L (ref 3.5–5.3)
Protein, Total: 7.1 g/dL (ref 6.0–8.3)
Sodium: 143 mEq/L (ref 135–145)

## 2021-10-29 LAB — URINALYSIS REFLEX TO MICROSCOPIC EXAM - REFLEX TO CULTURE
Bilirubin, UA: NEGATIVE
Blood, UA: NEGATIVE
Glucose, UA: NEGATIVE
Ketones UA: NEGATIVE
Leukocyte Esterase, UA: NEGATIVE
Nitrite, UA: NEGATIVE
RBC, UA: 0 /hpf (ref 0–5)
Specific Gravity UA: >=1.03 (ref 1.001–1.035)
Urine pH: 6 (ref 5.0–8.0)
Urobilinogen, UA: 0.2 mg/dL (ref 0.2–2.0)

## 2021-10-29 LAB — CBC AND DIFFERENTIAL
Absolute NRBC: 0 10*3/uL (ref 0.00–0.00)
Basophils Absolute Automated: 0.07 10*3/uL (ref 0.00–0.08)
Basophils Automated: 0.6 %
Eosinophils Absolute Automated: 0.11 10*3/uL (ref 0.00–0.44)
Eosinophils Automated: 0.9 %
Hematocrit: 45.4 % — ABNORMAL HIGH (ref 34.7–43.7)
Hgb: 15 g/dL — ABNORMAL HIGH (ref 11.4–14.8)
Immature Granulocytes Absolute: 0.02 10*3/uL (ref 0.00–0.07)
Immature Granulocytes: 0.2 %
Lymphocytes Absolute Automated: 3.77 10*3/uL — ABNORMAL HIGH (ref 0.42–3.22)
Lymphocytes Automated: 29.8 %
MCH: 31.9 pg (ref 25.1–33.5)
MCHC: 33 g/dL (ref 31.5–35.8)
MCV: 96.6 fL — ABNORMAL HIGH (ref 78.0–96.0)
MPV: 10.1 fL (ref 8.9–12.5)
Monocytes Absolute Automated: 0.93 10*3/uL — ABNORMAL HIGH (ref 0.21–0.85)
Monocytes: 7.4 %
Neutrophils Absolute: 7.73 10*3/uL — ABNORMAL HIGH (ref 1.10–6.33)
Neutrophils: 61.1 %
Nucleated RBC: 0 /100 WBC (ref 0.0–0.0)
Platelets: 204 10*3/uL (ref 142–346)
RBC: 4.7 10*6/uL (ref 3.90–5.10)
RDW: 13 % (ref 11–15)
WBC: 12.63 10*3/uL — ABNORMAL HIGH (ref 3.10–9.50)

## 2021-10-29 LAB — GFR: EGFR: >60

## 2021-10-29 LAB — COVID-19 (SARS-COV-2): SARS CoV 2 Overall Result: NOT DETECTED

## 2021-10-29 MED ORDER — KETOROLAC TROMETHAMINE 30 MG/ML IJ SOLN
30.0000 mg | Freq: Once | INTRAMUSCULAR | Status: AC
Start: 2021-10-29 — End: 2021-10-29
  Administered 2021-10-29: 30 mg via INTRAVENOUS
  Filled 2021-10-29: qty 1
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
## 2021-10-29 NOTE — ED Notes (Signed)
Bed: GR6  Expected date:   Expected time:   Means of arrival:   Comments:  Ye 1 to move here for social work consult in AM

## 2021-10-29 NOTE — ED Triage Notes (Signed)
Presents to the ED via EMS to triage with a c/o being evicted from her home today and need of resources for housing , no medical complaints. BP 140/70, HR 94, 95 % RA

## 2021-10-30 ENCOUNTER — Emergency Department: Payer: Self-pay

## 2021-10-30 ENCOUNTER — Emergency Department
Admission: EM | Admit: 2021-10-30 | Discharge: 2021-10-30 | Disposition: A | Discharge status: Home / Self Care | Payer: Self-pay | Attending: Emergency Medical Services | Admitting: Emergency Medical Services

## 2021-10-30 DIAGNOSIS — W19XXXA Unspecified fall, initial encounter: Secondary | ICD-10-CM | POA: Insufficient documentation

## 2021-10-30 DIAGNOSIS — S0990XA Unspecified injury of head, initial encounter: Secondary | ICD-10-CM | POA: Insufficient documentation

## 2021-10-30 DIAGNOSIS — Z20822 Contact with and (suspected) exposure to covid-19: Secondary | ICD-10-CM | POA: Insufficient documentation

## 2021-10-30 DIAGNOSIS — Z9181 History of falling: Secondary | ICD-10-CM

## 2021-10-30 LAB — ECG 12-LEAD
Atrial Rate: 75 {beats}/min
IHS MUSE NARRATIVE AND IMPRESSION: NORMAL
P Axis: 25 degrees
P-R Interval: 136 ms
Q-T Interval: 368 ms
QRS Duration: 72 ms
QTC Calculation (Bezet): 410 ms
R Axis: 40 degrees
T Axis: 51 degrees
Ventricular Rate: 75 {beats}/min

## 2021-10-30 MED ORDER — ALPRAZOLAM 0.5 MG PO TABS
0.5000 mg | ORAL_TABLET | Freq: Once | ORAL | Status: AC
Start: 2021-10-30 — End: 2021-10-30
  Administered 2021-10-30: 0.5 mg via ORAL
  Filled 2021-10-30: qty 1

## 2021-10-30 NOTE — Discharge Instructions (Addendum)
Please follow up with your primary doctor in 2 days.   Please return to the Emergency room for chest pain, shortness of breath, nausea, vomiting.

## 2021-10-30 NOTE — ED Notes (Signed)
Meal given
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
## 2021-10-30 NOTE — ED Provider Notes (Signed)
EMERGENCY DEPARTMENT NOTE     Patient initially seen and examined at   ED PHYSICIAN ASSIGNED       Date/Time Event User Comments    10/30/21 1939 Physician Assigned Macky Lower Macky Lower, MD assigned as Attending     HISTORY OF PRESENT ILLNESS   Patient Name: [name]   Medical Record Number: [id number]  AGE: 60 y.o.  DOB: June 10, 1961    Chief Complaint: Generalized Body Aches       [name] is a 60 y.o. female who presents with multiple complaints.  Patient is currently homeless due to being recently evicted from her house.  Patient endorsed having marital status problems.  Patient was seen at [facility] and had an entire lab work done yesterday and was discharged home.  Patient reports that she wanted to come into the emergency room for further evaluation of recent falls.  Patient reports that she had a fall few days ago and she is unsure if she hit her head.  Otherwise she is concerned about being homeless and would like other options.    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       Past Surgical History:  Past Surgical History:   Procedure Laterality Date    LEG SURGERY Left     Plate placed lower extremmity.       Social History:  Social History     Socioeconomic History    Marital status: Significant Other   Tobacco Use    Smoking status: Never    Smokeless tobacco: Never    Tobacco comments:     no use of cigarettes in the past 30 days    Vaping Use    Vaping Use: Never used   Substance and Sexual Activity    Alcohol use: Yes     Comment: celebratory, No alcohol use in the past 12 months     Drug use: Never     Comment: no use of substance in the past 30 days     Sexual activity: Not Currently       Family History:  History reviewed. No  pertinent family history.    Outpatient Medication:  Discharge Medication List as of 10/30/2021  9:21 PM        CONTINUE these medications which have NOT CHANGED    Details   dicyclomine (BENTYL) 10 MG capsule Take 1 capsule (10 mg total) by mouth every 6 (six) hours as needed (abdominal pain), Starting Fri 09/22/2020, E-Rx           REVIEW OF SYSTEMS   Review of Systems   Constitutional:  Negative for chills and fatigue.   HENT:  Negative for ear pain and sore throat.    Eyes:  Negative for pain and discharge.   Respiratory:  Negative for cough and chest tightness.    Cardiovascular:  Negative for chest pain and palpitations.   Gastrointestinal:  Negative for abdominal pain, constipation, nausea and vomiting.   Genitourinary:  Negative for dysuria and hematuria.   Musculoskeletal:  Negative for back pain and myalgias.   Skin:  Negative for rash and wound.   Neurological:  Negative for seizures  and syncope.      PHYSICAL EXAM     ED Triage Vitals [10/30/21 1724]   Enc Vitals Group      BP 118/82      Heart Rate 92      Resp Rate 20      Temp 98.3 F (36.8 C)      Temp src       SpO2 96 %      Weight 111.1 kg      Height 1.6 m      Head Circumference       Peak Flow       Pain Score 10      Pain Loc       Pain Edu?       Excl. in GC?        Physical Exam  Constitutional:       Appearance: Normal appearance.   HENT:      Head: Normocephalic and atraumatic.      Right Ear: External ear normal.      Left Ear: External ear normal.      Mouth/Throat:      Mouth: Mucous membranes are moist.   Eyes:      Extraocular Movements: Extraocular movements intact.      Pupils: Pupils are equal, round, and reactive to light.   Cardiovascular:      Rate and Rhythm: Normal rate and regular rhythm.   Pulmonary:      Effort: Pulmonary effort is normal.      Breath sounds: Normal breath sounds. No wheezing or rhonchi.   Chest:      Chest wall: No tenderness.   Abdominal:      General: Abdomen is flat. Bowel sounds are normal. There is  no distension.      Palpations: Abdomen is soft.      Tenderness: There is no abdominal tenderness. There is no guarding or rebound.   Musculoskeletal:         General: No swelling or tenderness. Normal range of motion.      Cervical back: Normal range of motion and neck supple.      Right lower leg: No edema.      Left lower leg: No edema.   Skin:     General: Skin is warm and dry.   Neurological:      General: No focal deficit present.      Mental Status: She is alert and oriented to person, place, and time.      Cranial Nerves: No cranial nerve deficit.      Sensory: No sensory deficit.      Motor: No weakness.      Coordination: Coordination normal.      Gait: Gait normal.      Deep Tendon Reflexes: Reflexes normal.         CARDIAC STUDIES    The following cardiac studies were independently interpreted by the Emergency Medicine Physician.  For full cardiac study results please see chart.    Monitor Strip:  Sinus rhythm at 80 BPM with no ectopy.   EMERGENCY IMAGING STUDIES    The following imagine studies were independently interpreted by me (emergency physician):    RADIOLOGY IMAGING STUDIES      CT Head without Contrast   Final Result       No hemorrhage or acute process.      Heron Nay, MD    10/30/2021 9:11 PM  PULSE OXIMETRY    Oxygen Saturation by Pulse Oximetry: 96%  Interventions: none  Interpretation:  Adequate    EMERGENCY DEPT. MEDICATIONS      ED Medication Orders (From admission, onward)      Start Ordered     Status Ordering Provider    10/30/21 2010 10/30/21 2009  ALPRAZolam Prudy Feeler) tablet 0.5 mg  Once        Route: Oral  Ordered Dose: 0.5 mg     Last MAR action: Given Gillian Kluever            LABORATORY RESULTS    Ordered and independently interpreted AVAILABLE laboratory tests. Please see results section in chart for full details.  Results for orders placed or performed during the hospital encounter of 10/29/21   COVID-19 (SARS-CoV-2) only ([facility]) - Required by Extended  care facility discharge within 24 hours    Specimen: Nasopharyngeal   Result Value Ref Range    Purpose of COVID testing Screening     SARS-CoV-2 Specimen Source Nasal Swab     SARS CoV 2 Overall Result Not Detected    CBC and differential   Result Value Ref Range    WBC 12.63 (H) 3.10 - 9.50 x10 3/uL    Hgb 15.0 (H) 11.4 - 14.8 g/dL    Hematocrit 44.0 (H) 34.7 - 43.7 %    Platelets 204 142 - 346 x10 3/uL    RBC 4.70 3.90 - 5.10 x10 6/uL    MCV 96.6 (H) 78.0 - 96.0 fL    MCH 31.9 25.1 - 33.5 pg    MCHC 33.0 31.5 - 35.8 g/dL    RDW 13 11 - 15 %    MPV 10.1 8.9 - 12.5 fL    Neutrophils 61.1 None %    Lymphocytes Automated 29.8 None %    Monocytes 7.4 None %    Eosinophils Automated 0.9 None %    Basophils Automated 0.6 None %    Immature Granulocytes 0.2 None %    Nucleated RBC 0.0 0.0 - 0.0 /100 WBC    Neutrophils Absolute 7.73 (H) 1.10 - 6.33 x10 3/uL    Lymphocytes Absolute Automated 3.77 (H) 0.42 - 3.22 x10 3/uL    Monocytes Absolute Automated 0.93 (H) 0.21 - 0.85 x10 3/uL    Eosinophils Absolute Automated 0.11 0.00 - 0.44 x10 3/uL    Basophils Absolute Automated 0.07 0.00 - 0.08 x10 3/uL    Immature Granulocytes Absolute 0.02 0.00 - 0.07 x10 3/uL    Absolute NRBC 0.00 0.00 - 0.00 x10 3/uL   Comprehensive metabolic panel   Result Value Ref Range    Glucose 156 (H) 70 - 100 mg/dL    BUN 10.2 7.0 - 72.5 mg/dL    Creatinine 0.8 0.4 - 1.0 mg/dL    Sodium 366 440 - 347 mEq/L    Potassium 4.4 3.5 - 5.3 mEq/L    Chloride 108 99 - 111 mEq/L    CO2 26 17 - 29 mEq/L    Calcium 9.2 8.5 - 10.5 mg/dL    Protein, Total 7.1 6.0 - 8.3 g/dL    Albumin 4.0 3.5 - 5.0 g/dL    AST (SGOT) 17 5 - 41 U/L    ALT 15 0 - 55 U/L    Alkaline Phosphatase 106 37 - 117 U/L    Bilirubin, Total 0.3 0.2 - 1.2 mg/dL    Globulin 3.1 2.0 - 3.6 g/dL    Albumin/Globulin Ratio 1.3 0.9 -  2.2    Anion Gap 9.0 5.0 - 15.0   Urinalysis Reflex to Microscopic Exam- Reflex to Culture   Result Value Ref Range    Urine Type Urine, Clean Ca     Color, UA Yellow  Colorless - Yellow    Clarity, UA Clear Clear - Hazy    Specific Gravity UA >=1.030 1.001 - 1.035    Urine pH 6.0 5.0 - 8.0    Leukocyte Esterase, UA Negative Negative    Nitrite, UA Negative Negative    Protein, UR Trace (A) Negative    Glucose, UA Negative Negative    Ketones UA Negative Negative    Urobilinogen, UA 0.2 0.2 - 2.0 mg/dL    Bilirubin, UA Negative Negative    Blood, UA Negative Negative    RBC, UA 0 -2 0 - 5 /hpf    WBC, UA 0 - 5 0 - 5 /hpf    Squamous Epithelial Cells, Urine 0 - 5 0 - 25 /hpf   GFR   Result Value Ref Range    EGFR >60.0     ECG 12 lead (Electrocardiogram)   Result Value Ref Range    Ventricular Rate 75 BPM    Atrial Rate 75 BPM    P-R Interval 136 ms    QRS Duration 72 ms    Q-T Interval 368 ms    QTC Calculation (Bezet) 410 ms    P Axis 25 degrees    R Axis 40 degrees    T Axis 51 degrees    [facility] MUSE NARRATIVE AND IMPRESSION       NORMAL SINUS RHYTHM  NORMAL ECG  WHEN COMPARED WITH ECG OF 12-Sep-2020 19:49,  CRITERIA FOR SEPTAL INFARCT ARE NO LONGER PRESENT  Confirmed by Philip Aspen MD, RAFIQ [phone]) on 10/30/2021 10:54:36 PM         PROCEDURES    Procedures        MEDICAL DECISION MAKING     DISCUSSION    Patient's condition remained stable during Emergency Department evaluation. The decision was made to obtain prior medical records. Past medical records found on epic reviewed.       Based on my initial evaluations orders written.      Patient initially treated with:   Medications   ALPRAZolam Prudy Feeler) tablet 0.5 mg (0.5 mg Oral Given 10/30/21 2016)          Upon re-examination: Patient resting comfortably no acute distress.  Reports significant improvement of anxiety after receiving Xanax here.  CT imaging reveals no acute abnormalities.  Patient feels comfortable being discharged.  A call was placed to a local shelter.  Patient will be discharged straight to the shelter.  Case was discussed with front desk management who was able to get the patient a ride.     Based on the patient's  stable condition, decision made with patient for discharge. Plan for discharge discussed with the patient, and all questions and concerns addressed accordingly. Patient states a clear understanding of the plan provided and is comfortable and agreeable.    Each of the differential diagnosis has been considered for diagnosis and weighed risk and benefit of further testing and evaluation in the context of patient complaint. Some diagnosis do not warrant further testing including imaging and/or labs. However, all differential diagnosis have been considered.          All labs and vital signs from the current visit have been reviewed and any abnormality that is present is not  due to severe sepsis or septic shock.    Vital Signs: Reviewed the patients vital signs.   Nursing Notes: Reviewed and utilized available nursing notes.  Medical Records Reviewed: Reviewed available past medical records.  Counseling: The emergency provider has spoken with the patient and discussed todays findings, in addition to providing specific details for the plan of care.  Questions are answered and there is agreement with the plan.      Prescriptions:  Discharge Medication List as of 10/30/2021  9:21 PM        CONTINUE these medications which have NOT CHANGED    Details   dicyclomine (BENTYL) 10 MG capsule Take 1 capsule (10 mg total) by mouth every 6 (six) hours as needed (abdominal pain), Starting Fri 09/22/2020, E-Rx                   DIAGNOSIS      Diagnosis:  Final diagnoses:   History of recent fall   Injury of head, initial encounter       Disposition:  ED Disposition       ED Disposition   Discharge    Condition   --    Date/Time   Tue Oct 30, 2021  9:21 PM    Comment   Frederik Schmidt Favor discharge to home/self care.    Condition at disposition: Stable                       This note was generated by the Epic EMR system/Dragon speech recognition and may contain inherent errors or omissions not intended by the user. Grammatical errors,  random word insertions, deletions, pronoun errors and incomplete sentences are occasional consequences of this technology due to software limitations. Not all errors are caught or corrected. If there are questions or concerns about the content of this note or information contained within the body of this dictation they should be addressed directly with the author for clarification.     Macky Lower, MD  10/30/21 [phone]

## 2021-10-30 NOTE — Discharge Instructions (Signed)
Your screening examination today was reassuring.     You were provided with extensive resources to find shelter. Please call to arrange for shelter. Also speak with Samantha.

## 2021-11-01 ENCOUNTER — Observation Stay: Payer: Self-pay

## 2021-11-01 ENCOUNTER — Emergency Department: Payer: Self-pay

## 2021-11-01 ENCOUNTER — Observation Stay
Admission: EM | Admit: 2021-11-01 | Discharge: 2021-11-02 | Disposition: A | Discharge status: Home / Self Care | Payer: Self-pay | Attending: Internal Medicine | Admitting: Internal Medicine

## 2021-11-01 DIAGNOSIS — Z609 Problem related to social environment, unspecified: Secondary | ICD-10-CM | POA: Insufficient documentation

## 2021-11-01 DIAGNOSIS — R531 Weakness: Secondary | ICD-10-CM | POA: Insufficient documentation

## 2021-11-01 DIAGNOSIS — G44209 Tension-type headache, unspecified, not intractable: Secondary | ICD-10-CM

## 2021-11-01 DIAGNOSIS — R059 Cough, unspecified: Secondary | ICD-10-CM | POA: Insufficient documentation

## 2021-11-01 DIAGNOSIS — G8929 Other chronic pain: Secondary | ICD-10-CM | POA: Insufficient documentation

## 2021-11-01 DIAGNOSIS — F32A Depression, unspecified: Secondary | ICD-10-CM | POA: Diagnosis present

## 2021-11-01 DIAGNOSIS — M79609 Pain in unspecified limb: Secondary | ICD-10-CM | POA: Insufficient documentation

## 2021-11-01 DIAGNOSIS — Z6841 Body Mass Index (BMI) 40.0 and over, adult: Secondary | ICD-10-CM | POA: Insufficient documentation

## 2021-11-01 DIAGNOSIS — Z5901 Sheltered homelessness: Secondary | ICD-10-CM | POA: Insufficient documentation

## 2021-11-01 DIAGNOSIS — R42 Dizziness and giddiness: Secondary | ICD-10-CM | POA: Insufficient documentation

## 2021-11-01 DIAGNOSIS — R1084 Generalized abdominal pain: Secondary | ICD-10-CM | POA: Insufficient documentation

## 2021-11-01 DIAGNOSIS — Z8673 Personal history of transient ischemic attack (TIA), and cerebral infarction without residual deficits: Secondary | ICD-10-CM | POA: Insufficient documentation

## 2021-11-01 DIAGNOSIS — Z20822 Contact with and (suspected) exposure to covid-19: Secondary | ICD-10-CM | POA: Insufficient documentation

## 2021-11-01 DIAGNOSIS — R2689 Other abnormalities of gait and mobility: Principal | ICD-10-CM | POA: Diagnosis present

## 2021-11-01 LAB — CBC AND DIFFERENTIAL
Absolute NRBC: 0 10*3/uL (ref 0.00–0.00)
Basophils Absolute Automated: 0.05 10*3/uL (ref 0.00–0.08)
Basophils Automated: 0.8 %
Eosinophils Absolute Automated: 0.14 10*3/uL (ref 0.00–0.44)
Eosinophils Automated: 2.2 %
Hematocrit: 41.5 % (ref 34.7–43.7)
Hgb: 13.2 g/dL (ref 11.4–14.8)
Immature Granulocytes Absolute: 0.02 10*3/uL (ref 0.00–0.07)
Immature Granulocytes: 0.3 %
Lymphocytes Absolute Automated: 2.03 10*3/uL (ref 0.42–3.22)
Lymphocytes Automated: 31.4 %
MCH: 31.6 pg (ref 25.1–33.5)
MCHC: 31.8 g/dL (ref 31.5–35.8)
MCV: 99.3 fL — ABNORMAL HIGH (ref 78.0–96.0)
MPV: 10.5 fL (ref 8.9–12.5)
Monocytes Absolute Automated: 0.55 10*3/uL (ref 0.21–0.85)
Monocytes: 8.5 %
Neutrophils Absolute: 3.67 10*3/uL (ref 1.10–6.33)
Neutrophils: 56.8 %
Nucleated RBC: 0 /100 WBC (ref 0.0–0.0)
Platelets: 162 10*3/uL (ref 142–346)
RBC: 4.18 10*6/uL (ref 3.90–5.10)
RDW: 13 % (ref 11–15)
WBC: 6.46 10*3/uL (ref 3.10–9.50)

## 2021-11-01 LAB — COMPREHENSIVE METABOLIC PANEL
ALT: 16 U/L (ref 0–55)
AST (SGOT): 17 U/L (ref 5–41)
Albumin/Globulin Ratio: 1.3 (ref 0.9–2.2)
Albumin: 3.5 g/dL (ref 3.5–5.0)
Alkaline Phosphatase: 88 U/L (ref 37–117)
Anion Gap: 8 (ref 5.0–15.0)
BUN: 18 mg/dL (ref 7.0–21.0)
Bilirubin, Total: 0.5 mg/dL (ref 0.2–1.2)
CO2: 28 mEq/L (ref 17–29)
Calcium: 8.6 mg/dL (ref 8.5–10.5)
Chloride: 108 mEq/L (ref 99–111)
Creatinine: 0.7 mg/dL (ref 0.4–1.0)
Globulin: 2.6 g/dL (ref 2.0–3.6)
Glucose: 145 mg/dL — ABNORMAL HIGH (ref 70–100)
Potassium: 4.1 mEq/L (ref 3.5–5.3)
Protein, Total: 6.1 g/dL (ref 6.0–8.3)
Sodium: 144 mEq/L (ref 135–145)

## 2021-11-01 LAB — COVID-19 (SARS-COV-2) & INFLUENZA  A/B, NAA (ROCHE LIAT)
Influenza A: NOT DETECTED
Influenza B: NOT DETECTED
SARS CoV 2 Overall Result: NOT DETECTED

## 2021-11-01 LAB — URINALYSIS REFLEX TO MICROSCOPIC EXAM - REFLEX TO CULTURE
Bilirubin, UA: NEGATIVE
Blood, UA: NEGATIVE
Glucose, UA: NEGATIVE
Ketones UA: NEGATIVE
Leukocyte Esterase, UA: NEGATIVE
Nitrite, UA: NEGATIVE
Protein, UR: NEGATIVE
Specific Gravity UA: >=1.03 (ref 1.001–1.035)
Urine pH: 5.5 (ref 5.0–8.0)
Urobilinogen, UA: 0.2 mg/dL (ref 0.2–2.0)

## 2021-11-01 LAB — GFR: EGFR: >60

## 2021-11-01 LAB — SEDIMENTATION RATE: Sed Rate: 28 mm/Hr — ABNORMAL HIGH (ref 0–20)

## 2021-11-01 MED ORDER — DICYCLOMINE HCL 10 MG PO CAPS
10.0000 mg | ORAL_CAPSULE | Freq: Four times a day (QID) | ORAL | Status: DC | PRN
Start: 2021-11-01 — End: 2021-11-02

## 2021-11-01 MED ORDER — ASPIRIN 81 MG PO CHEW
324.0000 mg | CHEWABLE_TABLET | Freq: Once | ORAL | Status: AC
Start: 2021-11-01 — End: 2021-11-01
  Administered 2021-11-01: 324 mg via ORAL
  Filled 2021-11-01: qty 4

## 2021-11-01 MED ORDER — ACETAMINOPHEN 500 MG PO TABS
1000.0000 mg | ORAL_TABLET | Freq: Once | ORAL | Status: AC
Start: 2021-11-01 — End: 2021-11-01
  Administered 2021-11-01: 1000 mg via ORAL
  Filled 2021-11-01: qty 2

## 2021-11-01 MED ORDER — SODIUM CHLORIDE 0.9 % IV SOLN
INTRAVENOUS | Status: DC
Start: 2021-11-01 — End: 2021-11-02

## 2021-11-01 MED ORDER — THIAMINE HCL 100 MG/ML IJ SOLN
100.0000 mg | Freq: Every day | INTRAMUSCULAR | Status: DC
Start: 2021-11-01 — End: 2021-11-02
  Administered 2021-11-01 – 2021-11-02 (×2): 100 mg via INTRAVENOUS
  Filled 2021-11-01 (×3): qty 1

## 2021-11-01 MED ORDER — MORPHINE SULFATE 2 MG/ML IJ/IV SOLN (WRAP)
2.0000 mg | Status: AC | PRN
Start: 2021-11-01 — End: 2021-11-01

## 2021-11-01 MED ORDER — SODIUM CHLORIDE 0.9 % IV BOLUS
1000.0000 mL | Freq: Once | INTRAVENOUS | Status: AC
Start: 2021-11-01 — End: 2021-11-01
  Administered 2021-11-01: 1000 mL via INTRAVENOUS

## 2021-11-01 MED ORDER — ENOXAPARIN SODIUM 40 MG/0.4ML IJ SOSY
40.0000 mg | PREFILLED_SYRINGE | Freq: Every day | INTRAMUSCULAR | Status: DC
Start: 2021-11-01 — End: 2021-11-02
  Administered 2021-11-01 – 2021-11-02 (×2): 40 mg via SUBCUTANEOUS
  Filled 2021-11-01 (×2): qty 0.4

## 2021-11-01 MED ORDER — LABETALOL HCL 5 MG/ML IV SOLN (WRAP)
10.0000 mg | INTRAVENOUS | Status: DC | PRN
Start: 2021-11-01 — End: 2021-11-02
  Filled 2021-11-01 (×96): qty 2

## 2021-11-01 MED ORDER — ASPIRIN 81 MG PO CHEW
81.0000 mg | CHEWABLE_TABLET | Freq: Every day | ORAL | Status: DC
Start: 2021-11-02 — End: 2021-11-02
  Administered 2021-11-02: 81 mg via ORAL
  Filled 2021-11-01: qty 1

## 2021-11-01 MED ORDER — INFLUENZA VAC SPLIT QUAD 0.5 ML IM SUSY
0.5000 mL | PREFILLED_SYRINGE | INTRAMUSCULAR | Status: DC | PRN
Start: 2021-11-01 — End: 2021-11-02

## 2021-11-01 MED ORDER — ACETAMINOPHEN 325 MG PO TABS
650.0000 mg | ORAL_TABLET | Freq: Four times a day (QID) | ORAL | Status: DC | PRN
Start: 2021-11-01 — End: 2021-11-02

## 2021-11-01 MED ORDER — HYDRALAZINE HCL 20 MG/ML IJ SOLN
10.0000 mg | INTRAMUSCULAR | Status: DC | PRN
Start: 2021-11-01 — End: 2021-11-02

## 2021-11-01 MED ORDER — ACETAMINOPHEN 325 MG PO TABS
650.0000 mg | ORAL_TABLET | Freq: Four times a day (QID) | ORAL | Status: AC | PRN
Start: 2021-11-01 — End: 2021-11-01

## 2021-11-01 NOTE — ED Notes (Signed)
Bed: GR10  Expected date: 11/01/21  Expected time: 7:28 AM  Means of arrival: [id number] - Sheria Lang  Comments:  [facility]

## 2021-11-01 NOTE — ED Notes (Signed)
[facility] EMERGENCY DEPARTMENT  ED NURSING NOTE FOR THE RECEIVING INPATIENT NURSE   ED NURSE Milagros Loll RN   [facility] [phone]   ED CHARGE RN [phone]   ADMISSION INFORMATION   [name] is a 60 y.o. female admitted with a diagnosis of:    1. Balance problem         Isolation: None   Allergies: Patient has no known allergies.   Holding Orders confirmed? Yes   Belongings Documented? Yes   Home medications sent to pharmacy confirmed? NA   NURSING CARE   Patient Comes From:   Mental Status: Other Pt lives at a shelter  alert   ADL: Independent with all ADLs   Ambulation: ambulates with: standby assist   Pertinent Information  and Safety Concerns: Pt lives at a shelter. She is pleasant. Dr.Alway saw pt in the ed.     COVID Test sent to lab? Yes   VITAL SIGNS   Time BP Temp Pulse Resp SpO2   1200 142/69 97.4 66 18 98%ra   CT / NIH   CT Head ordered on this patient?  Yes   NIH/Dysphagia assessment done prior to admission? Yes   PERSONAL PROTECTIVE EQUIPMENT   Face Shield, Gloves, Goggles and N95   LAB RESULTS   Labs Reviewed   CBC AND DIFFERENTIAL - Abnormal; Notable for the following components:       Result Value    MCV 99.3 (*)     All other components within normal limits   COMPREHENSIVE METABOLIC PANEL - Abnormal; Notable for the following components:    Glucose 145 (*)     All other components within normal limits   COVID-19 (SARS-COV-2) & INFLUENZA  A/B, NAA (ROCHE LIAT)    Narrative:     o Collect and clearly label specimen type:  o PREFERRED-Upper respiratory specimen: One Nasal Swab in  [employer].  o Hand deliver to laboratory ASAP  Diagnostic -PUI   GFR   URINALYSIS REFLEX TO MICROSCOPIC EXAM - REFLEX TO CULTURE          Ticket to Ride Printed: Yes

## 2021-11-01 NOTE — ED Provider Notes (Signed)
EMERGENCY DEPARTMENT HISTORY AND PHYSICAL EXAM    Date: 11/01/2021   Patient Name: [name]  Attending Physician: Suzy Bouchard, MD   Advanced Practice Provider: Zamoria Boss C Shanara Schnieders, PA    History of Presenting Illness     History Provided By: Patient  Chief Complaint:  Chief Complaint   Patient presents with    Fatigue     [name] is a 60 y.o. female with hx of previous strokes, depression, "questionable heart attacks" presenting to the ED with multiple complaints. Patient reports she was at the shelter today and reports eating oatmeal and choking. She reports coughing and she started to feel lightheaded and weakness. She reports a mild headache located to the front and sides of her head for the past 10 days. Reports that her "eyes are getting bad" over the last couple of weeks as well as with her balance. She repots she has had LT sided weakness probably from her stroke and "balance issues" but reports balance has gotten worse over the last couple of weeks.  Reports she has to hold on to walls to walk. She also reports multiple stressors at home including recently being evicted on Monday, having to put up her service dog at a foster home, and her husband who is [place] who recently suffered a gun shot wound. Denies any recent falls this month, numbness or tingling of face or extremities, CP, NVD, fever, leg swelling, HI/SI, hallucinations, drug use.  Review of Systems   Review of Systems   Constitutional:  Negative for fever.   Respiratory:  Positive for cough. Negative for chest tightness.    Cardiovascular:  Negative for chest pain.   Gastrointestinal:  Positive for abdominal pain. Negative for nausea and vomiting.   Neurological:  Positive for light-headedness and headaches. Negative for dizziness and numbness.   All other systems reviewed and are negative.  Physical Exam   Pulse 78  BP (!) 127/91  Resp 18  SpO2 97 %  Temp 97.3 F (36.3 C)    Pulse Oximetry Analysis - Normal SpO2: 97 % on  RA    Physical Exam  Vitals and nursing note reviewed.   Constitutional:       Appearance: Normal appearance.   HENT:      Head: Normocephalic and atraumatic.      Right Ear: External ear normal.      Left Ear: External ear normal.      Mouth/Throat:      Pharynx: Oropharynx is clear.   Eyes:      General: No scleral icterus.        Right eye: No discharge.         Left eye: No discharge.      Conjunctiva/sclera: Conjunctivae normal.   Cardiovascular:      Rate and Rhythm: Normal rate and regular rhythm.      Pulses: Normal pulses.      Heart sounds: Normal heart sounds.   Pulmonary:      Effort: Pulmonary effort is normal. No respiratory distress.      Breath sounds: Normal breath sounds. No stridor.   Abdominal:      General: Abdomen is flat. Bowel sounds are normal.      Palpations: Abdomen is soft.      Tenderness: There is abdominal tenderness.      Comments: Diffuse tenderness to abd. Patient grimacing on exam   Musculoskeletal:         General: Normal range of  motion.      Cervical back: Normal range of motion and neck supple.   Skin:     General: Skin is warm.      Comments: Exam only where exposed by patient   Neurological:      General: No focal deficit present.      Mental Status: She is alert and oriented to person, place, and time.      Sensory: No sensory deficit.      Motor: No weakness.      Coordination: Coordination normal.      Gait: Gait normal.   Psychiatric:         Mood and Affect: Mood normal.         Behavior: Behavior normal.     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    Past Surgical History:  Past Surgical History:   Procedure Laterality Date    LEG SURGERY Left     Plate placed lower extremmity.      Family/Social History:  She reports that she has never smoked. She has never used smokeless  tobacco. She reports current alcohol use. She reports that she does not use drugs.  History reviewed. No pertinent family history. Allergies:  No Known Allergies   Listed Medications on Arrival:  Home Medications       Med List Status: Complete Set By: Tiburcio Bash, RN at 11/01/2021  1:20 PM              acetaminophen (TYLENOL) 500 MG tablet     Take 500 mg by mouth as needed for Pain     dicyclomine (BENTYL) 10 MG capsule     Take 1 capsule (10 mg total) by mouth every 6 (six) hours as needed (abdominal pain)      Ongoing Comment    [name], RN    07/07/2020  7:24 PM     Takes no meds.             Primary Care Provider: Pcp, None, MD     Diagnostic Study Results     Labs -     Results       Procedure Component Value Units Date/Time    Sedimentation rate (ESR) [[id number]]  (Abnormal) Collected: 11/01/21 1354    Specimen: Blood Updated: 11/01/21 1412     Sed Rate 28 mm/Hr     Lyme Ab Tot Rflx to WB IGG/IGM [[id number]] Collected: 11/01/21 1354    Specimen: Blood Updated: 11/01/21 1356    Protein electrophoresis, serum [[id number]] Collected: 11/01/21 1354    Specimen: Blood Updated: 11/01/21 1356    Narrative:      This is NOT the correct Test for Patients with  Hemoglobinopathy.  Pro Electro Serum    Hemoglobin A1C [[id number]] Collected: 11/01/21 1354    Specimen: Blood Updated: 11/01/21 1356    Narrative:      This is NOT the correct Test for Patients with  Hemoglobinopathy.    Folate [[id number]] Collected: 11/01/21 1354    Specimen: Blood Updated: 11/01/21 1356    Narrative:      This is NOT the correct Test for Patients with  Hemoglobinopathy.    ANA Screen Only [[id number]] Collected:  11/01/21 1354    Specimen: Blood Updated: 11/01/21 1356    Rheumatoid factor [[id number]] Collected: 11/01/21 1354    Specimen: Blood Updated: 11/01/21 1356    Syphilis Screen IgG and IgM [[id number]] Collected: 11/01/21 1354     Updated: 11/01/21 1356    COVID-19 (SARS-CoV-2) and Influenza A/B, NAA ([facility])-  Admission [[id number]] Collected: 11/01/21 0828    Specimen: Culturette from Nasopharyngeal Updated: 11/01/21 0905     Purpose of COVID testing Diagnostic -[facility]     SARS-CoV-2 Specimen Source Nasal Swab     SARS CoV 2 Overall Result Not Detected     Influenza A Not Detected     Influenza B Not Detected    Narrative:      o Collect and clearly label specimen type:  o PREFERRED-Upper respiratory specimen: One Nasal Swab in  Transport Media.  o Hand deliver to laboratory ASAP  Diagnostic -[facility]    Urinalysis Reflex to Microscopic Exam- Reflex to Culture [[id number]] Collected: 11/01/21 0828     Updated: 11/01/21 0858     Urine Type Urine, Clean Ca     Color, UA Yellow     Clarity, UA Clear     Specific Gravity UA >=1.030     Urine pH 5.5     Leukocyte Esterase, UA Negative     Nitrite, UA Negative     Protein, UR Negative     Glucose, UA Negative     Ketones UA Negative     Urobilinogen, UA 0.2 mg/dL      Bilirubin, UA Negative     Blood, UA Negative     RBC, UA 0 - 2 /hpf      WBC, UA 0 - 5 /hpf      Squamous Epithelial Cells, Urine 0 - 5 /hpf      Hyaline Casts, UA 0 - 3 /lpf      Urine Mucus Present    Comprehensive metabolic panel [[id number]]  (Abnormal) Collected: 11/01/21 0828    Specimen: Blood Updated: 11/01/21 0857     Glucose 145 mg/dL      BUN 24.4 mg/dL      Creatinine 0.7 mg/dL      Sodium 010 mEq/L      Potassium 4.1 mEq/L      Chloride 108 mEq/L      CO2 28 mEq/L      Calcium 8.6 mg/dL      Protein, Total 6.1 g/dL      Albumin 3.5 g/dL      AST (SGOT) 17 U/L      ALT 16 U/L      Alkaline Phosphatase 88 U/L      Bilirubin, Total 0.5 mg/dL      Globulin 2.6 g/dL      Albumin/Globulin Ratio 1.3     Anion Gap 8.0    GFR [[id number]] Collected: 11/01/21 0828     Updated: 11/01/21 0857     EGFR >60.0       CBC and differential [[id number]]  (Abnormal) Collected: 11/01/21 0828    Specimen: Blood Updated: 11/01/21 0848     WBC 6.46 x10 3/uL      Hgb 13.2 g/dL      Hematocrit 63.8 %      Platelets 162 x10 3/uL      RBC  4.18 x10 6/uL      MCV 99.3 fL      MCH 31.6 pg      MCHC 31.8 g/dL  RDW 13 %      MPV 10.5 fL      Neutrophils 56.8 %      Lymphocytes Automated 31.4 %      Monocytes 8.5 %      Eosinophils Automated 2.2 %      Basophils Automated 0.8 %      Immature Granulocytes 0.3 %      Nucleated RBC 0.0 /100 WBC      Neutrophils Absolute 3.67 x10 3/uL      Lymphocytes Absolute Automated 2.03 x10 3/uL      Monocytes Absolute Automated 0.55 x10 3/uL      Eosinophils Absolute Automated 0.14 x10 3/uL      Basophils Absolute Automated 0.05 x10 3/uL      Immature Granulocytes Absolute 0.02 x10 3/uL      Absolute NRBC 0.00 x10 3/uL             Radiologic Studies -   Radiology Results (24 Hour)       Procedure Component Value Units Date/Time    MRI Brain WO Contrast [[id number]] Resulted: 11/01/21 1735    Order Status: Sent Updated: 11/01/21 1801    CT Abd/Pelvis without Contrast [[id number]] Collected: 11/01/21 1002    Order Status: Completed Updated: 11/01/21 1008    Narrative:       CT ABDOMEN PELVIS WO IV/ WO PO CONT    CLINICAL HISTORY: Abdominal pain. diffuse abd pain    TECHNIQUE: Axial computed tomography of the abdomen was obtained from  the dome of the diaphragm to the iliac crests. Then, axial spiral CT of  the pelvis was obtained from the iliac crests to the symphysis pubis.  Intravenous contrast was not utilized. Oral contrast was not utilized.  Lack of IV contrast makes assessment of abdominal and pelvic structures  suboptimal. Lack of oral contrast makes assessment of bowel and some  adjacent structures suboptimal.  CT Dose reduction technique: One or more the following dose reduction  techniques were utilized: Automated exposure control; Adjustment of the  MVA and/or KVP according to patient's size; Use of the iterative  reconstruction technique.    COMPARISON: 09/22/2020     FINDINGS: No gross abnormality in the visualized liver and spleen. Aorta  and IVC are not enlarged. Pancreas is atrophic. Gallbladder  is  surgically absent.. Adrenals are not enlarged. No significant  adenopathy. Bowel loops are nondilated. No focal collection or mass.     Both kidneys show normal contour. No hydronephrosis.  No kidney stones.     Bladder appears within normal limits. Vessels are nondilated. Bowel  loops in the pelvis are nondilated. No destructive bone process in the  pelvis. The uterus is not visualized, likely due to hysterectomy.        Impression:       NO ACUTE ABNORMALITY.    Darnelle Maffucci, MD   11/01/2021 10:06 AM    CT Head without Contrast [[id number]] Collected: 11/01/21 0957    Order Status: Completed Updated: 11/01/21 1000    Narrative:      CT HEAD WO CONTRAST    CLINICAL INDICATION: Acute neurological changes. CVA.    TECHNIQUE:  Noncontrast CT scan of the head was performed. Axial images  were obtained. Sagittal and coronal MPR reformatting performed.  CT Dose reduction technique: One or more the following dose reduction  techniques were utilized: Automated exposure control; Adjustment of the  MVA and/or KVP according to patient's size; Use of the iterative  reconstruction technique.  COMPARISON: 10/30/2021     FINDINGS:  The ventricles and cisterns are clear. No acute bleed. No  acute cortical ischemic abnormality. No mass effect or midline shift. No  gross abnormality in the posterior fossa; beam hardening artifacts are  seen extensively..  No evidence for an acute intracranial abnormality.        Impression:          NO ACUTE INTRACRANIAL ABNORMALITY.    Darnelle Maffucci, MD   11/01/2021 9:58 AM    XR Chest 2 Views [[id number]] Collected: 11/01/21 0919    Order Status: Completed Updated: 11/01/21 0921    Narrative:      XR CHEST 2 VIEWS    CLINICAL INDICATION: Shortness of breath few months and cough    COMPARISON: 10/29/2021     TECHNIQUE: The following radiographs were obtained per protocol:   XR  CHEST 2 VIEWS    FINDINGS:  Cardiomediastinal silhouette is within normal limits. No  alveolar infiltrate. No effusion. No  active disease.  The osseous  structures are unremarkable. No acute abnormalities are apparent.      Impression:           NO SIGNIFICANT ABNORMALITY.    Darnelle Maffucci, MD   11/01/2021 9:19 AM        .    Procedures   Procedures:   Procedures  Medical Decision Making   I am the first provider for this patient. I reviewed the vital signs, available nursing notes, past medical history, past surgical history, family history and social history.  Old Medical Records: Nursing notes.   Vital Signs-I have reviewed the patient's vital signs.   Patient Vitals for the past 12 hrs:   BP Temp Pulse Resp   11/01/21 1506 91/55 97.8 F (36.6 C) 65 18   11/01/21 1503 112/83 -- 84 --   11/01/21 1500 109/75 -- 86 --   11/01/21 1400 93/67 -- 75 --   11/01/21 1400 119/75 -- 71 --   11/01/21 1358 96/67 -- (!) 58 --   11/01/21 1300 96/66 97.7 F (36.5 C) (!) 59 16   11/01/21 1200 142/69 -- 66 18   11/01/21 1152 -- 97.4 F (36.3 C) -- --   11/01/21 1025 124/57 97.6 F (36.4 C) 60 18   11/01/21 0738 (!) 127/91 97.3 F (36.3 C) 78 18       EKG:  Interpreted by Drusilla Kanner, PA and Emergency Physician   Time Interpreted:0830   Rate: 63 bpm   Rhythm: Normal Sinus   Interpretation:NSR. No ischemic changes noted.   Comparison: No change compared to prior EKG dated 10/29/2021    Clinical Decision Support:   NIH Stroke Score      Flowsheet Row Most Recent Value   Patient's calculated Stroke Score: 1 filed at 11/01/2021 1500              Medications Given in the ED:  ED Medication Orders (From admission, onward)      Start Ordered     Status Ordering Provider    11/01/21 1006 11/01/21 1005  aspirin chewable tablet 324 mg  Once        Route: Oral  Ordered Dose: 324 mg     Last MAR action: Given Drusilla Kanner    11/01/21 6644 11/01/21 0810  acetaminophen (TYLENOL) tablet 1,000 mg  Once        Route: Oral  Ordered Dose: 1,000 mg     Last MAR action:  Given Drusilla Kanner    11/01/21 1610 11/01/21 0810  sodium chloride 0.9 % bolus 1,000 mL   Once        Route: Intravenous  Ordered Dose: 1,000 mL     Last MAR action: Stopped Barbee Cough C            Medications Prescribed:  Discharge Prescriptions    None         ED Course:   ED Course as of 11/01/21 1808   Thu Nov 01, 2021   0911 NIH stroke scale 0. [YK]   0911 Discussed hx of stroke and with concerns for worsening balance in pt with Dr.Atmar. Recommended ruling out posterior stroke and admission. [YK]   1102 Neg CT and head. [YK]   1120 Spoke with Dr.Alway, okay with admission. Will put in MRI orders himself. [YK]   1129 SOUND epic chatted [YK]   1156 Dr.Abdulaaima okay with admission. Obs Unit 25 [YK]      ED Course User Index  [YK] Verlie Liotta, Gerda Diss, PA       Provider Notes:    60 y.o. female with complaints of worsening balance x few weeks. NIH score 0. No acute neurological deficits today. Do not suspect peripheral vertigo based on exam. No leukocytosis. H&H stable. Do not suspect infection, anemia, or electrolyte abnormalities. EKG without ischemic changes. VS stable. CT head negative. Neg COVID and Flu. Discussed with neurology to admit patient for concerns of posterior stroke, non acute, specially in a setting of to patient's homelessness and possible lack of outpatient resources/follow up. Dr.Always consulted.    D/w Suzy Bouchard, MD    Primary Admitting Service:    I have discussed this case with Dr. Wynelle Bourgeois. [facility], who accepts patient for admission and requests Observation Floor bed.     For Surgical Admissions (or any patients with potential surgical or procedural intervention during admission): only if applicable, otherwise delete this section    Anticoagulated: No  .     Consultant(s): (if applicable, otherwise delete this section)    I have discussed this case with consultant Dr. Ellamae Sia,. Neurology. 1120 am. Recommendations were as follows: admit       Diagnosis     Clinical Impression:   1. Lightheadedness    2. Balance problem    3. Tension-type headache, not intractable,  unspecified chronicity pattern        Treatment Plan:   ED Disposition       ED Disposition   Observation    Condition   --    Date/Time   Thu Nov 01, 2021 1157    Comment   Admitting Physician: Garlan Fillers [[id number]]   Service:: Medicine [[id number]]   Estimated Length of Stay: < 2 midnights   Tentative Discharge Plan?: Home or Self Care [1]   Does patient need telemetry?: No               _____________________________    CHART OWNERSHIP: I, Shadee Montoya, PA-C, am the primary clinician of record.       Drusilla Kanner, PA  11/01/21 1518       Drusilla Kanner, [place]  11/01/21 4098

## 2021-11-01 NOTE — Plan of Care (Signed)
Pt is alert, oriented X 4, on RA. NIHSS 1.    Denies any pain or SOB.     All safety measures in place.    Will continue close monitoring.                                                     CARE PLAN:      Problem: Safety  Goal: Patient will be free from injury during hospitalization  Outcome: Progressing  Flowsheets (Taken 11/01/2021 1645 by Lenard Lance, RN)  Patient will be free from injury during hospitalization:   Assess patient's risk for falls and implement fall prevention plan of care per policy   Provide and maintain safe environment   Use appropriate transfer methods   Ensure appropriate safety devices are available at the bedside   Include patient/ family/ care giver in decisions related to safety   Hourly rounding  Goal: Patient will be free from infection during hospitalization  Outcome: Progressing  Flowsheets (Taken 11/01/2021 1645 by Lenard Lance, RN)  Free from Infection during hospitalization:   Assess and monitor for signs and symptoms of infection   Monitor lab/diagnostic results   Encourage patient and family to use good hand hygiene technique     Problem: Pain  Goal: Pain at adequate level as identified by patient  Outcome: Progressing  Flowsheets (Taken 11/01/2021 2244)  Pain at adequate level as identified by patient:   Identify patient comfort function goal   Assess pain on admission, during daily assessment and/or before any "as needed" intervention(s)   Assess for risk of opioid induced respiratory depression, including snoring/sleep apnea. Alert healthcare team of risk factors identified.   Reassess pain within 30-60 minutes of any procedure/intervention, per Pain Assessment, Intervention, Reassessment (AIR) Cycle   Evaluate patient's satisfaction with pain management progress   Evaluate if patient comfort function goal is met   Offer non-pharmacological pain management interventions   Consult/collaborate with Pain Service     Problem: Moderate/High Fall Risk Score  >5  Goal: Patient will remain free of falls  Outcome: Progressing  Flowsheets  Taken 11/01/2021 2000 by Randall An, RN  High (Greater than 13):   LOW-Fall Interventions Appropriate for Low Fall Risk   HIGH-Consider use of low bed   HIGH-Initiate use of floor mats as appropriate   HIGH-Apply yellow "Fall Risk" arm band   HIGH-Bed alarm on at all times while patient in bed   HIGH-Visual cue at entrance to patient's room   MOD-Perform dangle, stand, walk (DSW) prior to mobilization  Taken 11/01/2021 1645 by Lenard Lance, RN  Moderate Risk (6-13):   LOW-Fall Interventions Appropriate for Low Fall Risk   LOW-Anticoagulation education for injury risk   MOD-Consider activation of bed alarm if appropriate   MOD-Floor mat at bedside (where available) if appropriate   MOD-Consider a move closer to Nurses Station   MOD-Remain with patient during toileting   MOD-Utilize diversion activities   MOD-Perform dangle, stand, walk (DSW) prior to mobilization   MOD-Request PT/OT consult order for patients with gait/mobility impairment     Problem: Compromised Tissue integrity  Goal: Damaged tissue is healing and protected  Outcome: Progressing  Flowsheets (Taken 11/01/2021 2244)  Damaged tissue is healing and protected:   Monitor/assess Braden scale every shift   Provide wound care per wound care algorithm  Increase activity as tolerated/progressive mobility   Relieve pressure to bony prominences for patients at moderate and high risk   Avoid shearing injuries   Reposition patient every 2 hours and as needed unless able to reposition self   Keep intact skin clean and dry   Use bath wipes, not soap and water, for daily bathing   Use incontinence wipes for cleaning urine, stool and caustic drainage. Foley care as needed   Monitor external devices/tubes for correct placement to prevent pressure, friction and shearing   Monitor patient's hygiene practices   Encourage use of lotion/moisturizer on skin  Goal: Nutritional  status is improving  Outcome: Progressing  Flowsheets (Taken 11/01/2021 2244)  Nutritional status is improving:   Assist patient with eating   Allow adequate time for meals   Encourage patient to take dietary supplement(s) as ordered     Problem: Every Day - Stroke  Goal: Core/Quality measure requirements - Daily  Outcome: Progressing  Flowsheets (Taken 11/01/2021 1645 by Lenard Lance, RN)  Core/Quality measure requirements - Daily:   VTE Prevention: Ensure anticoagulant(s) administered and/or anti-embolism stockings/devices documented by end of day 2   Ensure antithrombotic administered or contraindication documented by LIP by end of day 2   Continue stroke education (must include Modifiable Risk Factors, Warning Signs and Symptoms of Stroke, Activation of Emergency Medical System and Follow-up Appointments). Ensure handout has been given and documented.  Goal: Neurological status is stable or improving  Outcome: Progressing  Flowsheets (Taken 11/01/2021 1645 by Lenard Lance, RN)  Neurological status is stable or improving:   Monitor/assess/document neurological assessment (Stroke: every 4 hours)   Monitor/assess NIH Stroke Scale   Re-assess NIH Stroke Scale for any change in status   Observe for seizure activity and initiate seizure precautions if indicated   Perform CAM Assessment  Goal: Stable vital signs and fluid balance  Outcome: Progressing  Flowsheets (Taken 11/01/2021 1645 by Lenard Lance, RN)  Stable vital signs and fluid balance:   Position patient for maximum circulation/cardiac output   Monitor and assess vitals every 4 hours or as ordered and hemodynamic parameters   Apply telemetry monitor as ordered  Goal: Patient will maintain adequate oxygenation  Outcome: Progressing  Flowsheets (Taken 11/01/2021 1645 by Lenard Lance, RN)  Patient will maintain adequate oxygenation:   Suction secretions as needed   Maintain SpO2 of greater than 92%  Goal: Patient's risk of  aspiration will be minimized  Outcome: Progressing  Flowsheets (Taken 11/01/2021 1645 by Lenard Lance, RN)  Patient's risk of aspiration will be minimized:   Complete new dysphagia screen for any change in status: Keep patient NPO if patient fails   Place swallow precaution signage above bed   Monitor/assess for signs of aspiration (tachypnea, cough, wheezing, clearing throat, hoarseness after eating, decrease in SaO2   Order modified texture diet as recommend by Speech Pathologist   Thicken liquids as recommended by Speech Pathologist   Assess and monitor ability to swallow   Keep head of bed up a minimum of 30 degrees when hemodynamically stable   Place patient up in chair to eat, if possible   HOB up 90 degrees to eat if unable to be OOB  Goal: Mobility/Activity is maintained at optimal level for patient  Outcome: Progressing  Flowsheets (Taken 11/01/2021 1645 by Lenard Lance, RN)  Mobility/activity is maintained at optimal level for patient:   Encourage independent activity per ability   Maintain proper body alignment   Perform active/passive ROM   Increase  mobility as tolerated/progressive mobility   Plan activities to conserve energy, plan rest periods   Reposition patient every 2 hours and as needed unless able to reposition self   Assess for changes in respiratory status, level of consciousness and/or development of fatigue
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
## 2021-11-01 NOTE — ED Notes (Signed)
Called [phone] and [phone] ct scan but no one answered so will try again.

## 2021-11-01 NOTE — Plan of Care (Signed)
NURSING SHIFT NOTE     Patient: [name]  Day: 0      SHIFT EVENTS     Shift Narrative/Significant Events (PRN med administration, fall, RRT, etc.):     Patient transferred from unit 23 to unit 25 for R/O CVA around 1500  Patient AAOx4 on RA with no complaints of pain  Patient reports multiple in the past from weak legs  SCD's placed, NIHSS and Neuro check completed  Patient ambulates with a standby assist  Orthostatic BP taken  Patient on continuous fluids   Telemetry monitor placed but removed for MRI and placed back when patient returned     Safety and fall precautions remain in place. Purposeful rounding completed.          ASSESSMENT     Changes in assessment from patient's baseline this shift:    Neuro: No  CV: No  Pulm: No  Peripheral Vascular: No  HEENT: No  GI: No  BM during shift: No, Last BM: Last BM Date: 10/31/21  GU: No   Integ: No  MS: No    Pain: None  Pain Interventions: None  Medications Utilized:     Mobility: PMP Activity: Step 6 - Walks in Room of             Lines     Patient Lines/Drains/Airways Status       Active Lines, Drains and Airways       Name Placement date Placement time Site Days    Peripheral IV 11/01/21 20 G Left Antecubital 11/01/21  0815  Antecubital  less than 1                         VITAL SIGNS     Vitals:    11/01/21 1506   BP: 91/55   Pulse: 65   Resp: 18   Temp: 97.8 F (36.6 C)   SpO2: 97%       Temp  Min: 97.3 F (36.3 C)  Max: 97.8 F (36.6 C)  Pulse  Min: 58  Max: 86  Resp  Min: 16  Max: 18  BP  Min: 91/55  Max: 142/69  SpO2  Min: 97 %  Max: 98 %      Intake/Output Summary (Last 24 hours) at 11/01/2021 1710  Last data filed at 11/01/2021 1423  Gross per 24 hour   Intake 0 ml   Output --   Net 0 ml          CARE PLAN        Problem: Safety  Goal: Patient will be free from injury during hospitalization  Outcome: Progressing  Flowsheets (Taken 11/01/2021 1645)  Patient will be free from injury during hospitalization:   Assess patient's risk for falls and  implement fall prevention plan of care per policy   Provide and maintain safe environment   Use appropriate transfer methods   Ensure appropriate safety devices are available at the bedside   Include patient/ family/ care giver in decisions related to safety   Hourly rounding  Goal: Patient will be free from infection during hospitalization  Outcome: Progressing  Flowsheets (Taken 11/01/2021 1645)  Free from Infection during hospitalization:   Assess and monitor for signs and symptoms of infection   Monitor lab/diagnostic results   Encourage patient and family to use good hand hygiene technique     Problem: Moderate/High Fall Risk Score >5  Goal: Patient will remain free  of falls  Outcome: Progressing  Flowsheets (Taken 11/01/2021 1645)  Moderate Risk (6-13):   LOW-Fall Interventions Appropriate for Low Fall Risk   LOW-Anticoagulation education for injury risk   MOD-Consider activation of bed alarm if appropriate   MOD-Floor mat at bedside (where available) if appropriate   MOD-Consider a move closer to Nurses Station   MOD-Remain with patient during toileting   MOD-Utilize diversion activities   MOD-Perform dangle, stand, walk (DSW) prior to mobilization   MOD-Request PT/OT consult order for patients with gait/mobility impairment  High (Greater than 13):   HIGH-Utilize chair pad alarm for patient while in the chair   HIGH-Bed alarm on at all times while patient in bed   HIGH-Visual cue at entrance to patient's room   HIGH-Apply yellow "Fall Risk" arm band   HIGH-Initiate use of floor mats as appropriate     Problem: Day of Admission - Stroke  Goal: Core/Quality measure requirements - Admission  Outcome: Progressing  Flowsheets (Taken 11/01/2021 1645)  Core/Quality measure requirements - Admission:   Document NIH Stroke Scale on admission   Document nursing swallow/dysphagia screen on admission. If patient fails, keep patient NPO (follow your hospital protocol on swallowing screening).   VTE Prevention: Ensure  anticoagulant(s) administered and/or anti-embolism stockings/devices documented as ordered   Begin stroke education on admission (must include Modifiable Risk Factors, Warning Signs and Symptoms of Stroke, Activation of Emergency Medical System and Follow-up Appointments) Ensure handout has been given and documented.     Problem: Every Day - Stroke  Goal: Core/Quality measure requirements - Daily  Outcome: Progressing  Flowsheets (Taken 11/01/2021 1645)  Core/Quality measure requirements - Daily:   VTE Prevention: Ensure anticoagulant(s) administered and/or anti-embolism stockings/devices documented by end of day 2   Ensure antithrombotic administered or contraindication documented by LIP by end of day 2   Continue stroke education (must include Modifiable Risk Factors, Warning Signs and Symptoms of Stroke, Activation of Emergency Medical System and Follow-up Appointments). Ensure handout has been given and documented.  Goal: Neurological status is stable or improving  Outcome: Progressing  Flowsheets (Taken 11/01/2021 1645)  Neurological status is stable or improving:   Monitor/assess/document neurological assessment (Stroke: every 4 hours)   Monitor/assess NIH Stroke Scale   Re-assess NIH Stroke Scale for any change in status   Observe for seizure activity and initiate seizure precautions if indicated   Perform CAM Assessment  Goal: Stable vital signs and fluid balance  Outcome: Progressing  Flowsheets (Taken 11/01/2021 1645)  Stable vital signs and fluid balance:   Position patient for maximum circulation/cardiac output   Monitor and assess vitals every 4 hours or as ordered and hemodynamic parameters   Apply telemetry monitor as ordered  Goal: Patient will maintain adequate oxygenation  Outcome: Progressing  Flowsheets (Taken 11/01/2021 1645)  Patient will maintain adequate oxygenation:   Suction secretions as needed   Maintain SpO2 of greater than 92%  Goal: Patient's risk of aspiration will be  minimized  Outcome: Progressing  Flowsheets (Taken 11/01/2021 1645)  Patient's risk of aspiration will be minimized:   Complete new dysphagia screen for any change in status: Keep patient NPO if patient fails   Place swallow precaution signage above bed   Monitor/assess for signs of aspiration (tachypnea, cough, wheezing, clearing throat, hoarseness after eating, decrease in SaO2   Order modified texture diet as recommend by Speech Pathologist   Thicken liquids as recommended by Speech Pathologist   Assess and monitor ability to swallow   Keep head  of bed up a minimum of 30 degrees when hemodynamically stable   Place patient up in chair to eat, if possible   HOB up 90 degrees to eat if unable to be OOB  Goal: Mobility/Activity is maintained at optimal level for patient  Outcome: Progressing  Flowsheets (Taken 11/01/2021 1645)  Mobility/activity is maintained at optimal level for patient:   Encourage independent activity per ability   Maintain proper body alignment   Perform active/passive ROM   Increase mobility as tolerated/progressive mobility   Plan activities to conserve energy, plan rest periods   Reposition patient every 2 hours and as needed unless able to reposition self   Assess for changes in respiratory status, level of consciousness and/or development of fatigue

## 2021-11-01 NOTE — ED Triage Notes (Signed)
BIBA from shelter. Was in this ER Monday after being evicted from home, requested social assistance. Pt did receive medical screening and was allowed to stay the night in ER before discharge to shelter. Pt arrives today stating she was eating oatmeal and began coughing and things "went downhill from there." Pt has no specific complaint, states she believes that emotional stress has simply caused some physical symptoms
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
## 2021-11-01 NOTE — Nursing Progress Note (Signed)
Transfer order received. Transfer patient to [facility] with all her personal belongings. Report given to [place], Charity fundraiser.

## 2021-11-02 ENCOUNTER — Observation Stay: Payer: Self-pay

## 2021-11-02 LAB — CBC
Absolute NRBC: 0 10*3/uL (ref 0.00–0.00)
Hematocrit: 36.5 % (ref 34.7–43.7)
Hgb: 11.8 g/dL (ref 11.4–14.8)
MCH: 32.2 pg (ref 25.1–33.5)
MCHC: 32.3 g/dL (ref 31.5–35.8)
MCV: 99.5 fL — ABNORMAL HIGH (ref 78.0–96.0)
MPV: 10 fL (ref 8.9–12.5)
Nucleated RBC: 0 /100 WBC (ref 0.0–0.0)
Platelets: 137 10*3/uL — ABNORMAL LOW (ref 142–346)
RBC: 3.67 10*6/uL — ABNORMAL LOW (ref 3.90–5.10)
RDW: 13 % (ref 11–15)
WBC: 5.6 10*3/uL (ref 3.10–9.50)

## 2021-11-02 LAB — BASIC METABOLIC PANEL
Anion Gap: 6 (ref 5.0–15.0)
BUN: 17 mg/dL (ref 7.0–21.0)
CO2: 25 mEq/L (ref 17–29)
Calcium: 8.5 mg/dL (ref 8.5–10.5)
Chloride: 113 mEq/L — ABNORMAL HIGH (ref 99–111)
Creatinine: 0.6 mg/dL (ref 0.4–1.0)
Glucose: 120 mg/dL — ABNORMAL HIGH (ref 70–100)
Potassium: 4.1 mEq/L (ref 3.5–5.3)
Sodium: 144 mEq/L (ref 135–145)

## 2021-11-02 LAB — GFR: EGFR: >60

## 2021-11-02 LAB — PROTEIN ELECTROPHORESIS, SERUM
Albumin %: 49.8 % (ref 46.6–62.6)
Albumin: 2.8 g/dL — ABNORMAL LOW (ref 3.4–4.8)
Alpha-1 Glob %: 3 % (ref 1.7–4.1)
Alpha-1 Globulin: 0.2 g/dL (ref 0.1–0.4)
Alpha-2 Glob %: 16.4 % — ABNORMAL HIGH (ref 8.9–14.9)
Alpha-2 Globulin: 0.9 g/dL (ref 0.8–1.2)
Beta Glob %: 17.5 % (ref 10.9–18.9)
Beta Globulin: 1 g/dL (ref 0.6–1.2)
Gamma Globulin %: 13.4 % (ref 9.8–24.4)
Gamma Globulin: 0.8 g/dL (ref 0.6–1.7)
Protein, Total: 5.7 g/dL — ABNORMAL LOW (ref 6.0–8.3)

## 2021-11-02 LAB — LIPID PANEL
Cholesterol / HDL Ratio: 5.1 Index
Cholesterol: 169 mg/dL (ref 0–199)
HDL: 33 mg/dL — ABNORMAL LOW (ref 40–9999)
LDL Calculated: 111 mg/dL — ABNORMAL HIGH (ref 0–99)
Triglycerides: 126 mg/dL (ref 34–149)
VLDL Calculated: 25 mg/dL (ref 10–40)

## 2021-11-02 LAB — RHEUMATOID FACTOR: Rheumatoid Factor: <13 IU/mL (ref 0.0–30.0)

## 2021-11-02 LAB — PT/INR
PT INR: 1 (ref 0.9–1.1)
PT: 11.4 s (ref 10.1–12.9)

## 2021-11-02 LAB — APTT: PTT: 39 s (ref 27–39)

## 2021-11-02 LAB — ANA SCREEN ONLY: ANA Screen: NEGATIVE

## 2021-11-02 LAB — SYPHILIS SCREEN IGG AND IGM: Syphilis Screen IgG and IgM: NONREACTIVE

## 2021-11-02 LAB — HEMOGLOBIN A1C
Average Estimated Glucose: 119.8 mg/dL
Hemoglobin A1C: 5.8 % (ref 4.6–5.9)

## 2021-11-02 LAB — LYME AB, TOTAL,REFLEX TO WESTERN BLOT (IGG & IGM): Lyme AB,Total,Reflx to WB(IGM): 0.07 Index

## 2021-11-02 LAB — HEMOLYSIS INDEX: Hemolysis Index: 2 Index (ref 0–24)

## 2021-11-02 LAB — SERUM PROTEIN ELECTROPHORESIS REVIEW

## 2021-11-02 LAB — FOLATE: Folate: 12.4 ng/mL

## 2021-11-02 NOTE — UM Notes (Signed)
Order [id number]    11/01/21 1157  Admit to Observation  Once             Initial Review  Class: Observation    Summary:  The patient is a 60 year old female with a history of CVA who presented to the hospital with inability to stand or walk.  Patient reports a chocking episode with first bite of oatmeal the morning of presentation and then feeling lightheaded and generalized weakness.      Care Day - 11/01/2021      Vitals:    Weight: 106.8 kg      Labs:  Sed Rate: 28      Medications:  Acetaminophen 1,000 mg PO x once  Acetaminophen 324 mg PO x once  Enoxaparin 40 mg SC daily  Sodium Chloride 0.9% 1,000 ml IV bolus x once  Thiamine 100 mg IV daily  0.9% NaCl 100 ml/hr IV continuous        Plan of Care:  -  CT chest  -  CT head  -  CT abdomen/pelvis  -  MRI brain  -  X-Ray chest  -  Carotid dopplers  -  Urinalysis  -  Orthostatic vitals  -  NPO  -  Neuro checks  -  Neurology consult  -  Telemetry monitoring  -  PT/OT  -  SLP      ____________________________________________________________________________      Care Day - 11/02/2021      Vitals:  Temp:  [97.7 F (36.5 C)-99.6 F (37.6 C)] 98.4 F (36.9 C)  Heart Rate:  [58-86] 77  Resp Rate:  [16-18] 18  BP: (91-132)/(55-83) 132/71  SpO2: 94-96% on room air  Weight: 108.7 kg      Labs:  Platelets: 137  HDL: 33  LDL Calculated: 111  Albumin: 2.8      Medications:  Current Facility-Administered Medications   Medication Dose Route Frequency    aspirin  81 mg Oral Daily    enoxaparin  40 mg Subcutaneous Daily    thiamine  100 mg Intravenous Daily      sodium chloride 100 mL/hr at 11/02/21 0501           Plan of Care:  -  MRI Brain  -  US Carotid         Doree Albee RN, BSN  UR Case Manager  [facility]  [email or web address]  2:26 PM  11/02/2021
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
## 2021-11-02 NOTE — PT Eval Note (Signed)
Physical Therapy Eval and Treatment  [name]      Post Acute Care Therapy Recommendations   Discharge Recommendations:  Home with home health PT (will also benefit from outpatient PT for higher level balance training once appropiate- pt reports she plans to move from the shelter to a motel)    DME needs IF patient is discharging home: Front wheel walker    Therapy discharge recommendations may change with patient status.  Please refer to most recent note for up-to-date recommendations.    Is an Occupational Therapy Evaluation Indicated at this time? No, an acute care OT evaluation is not indicated yet; PT will continue to assess.    Unit: [facility]  Bed: J8119/J4782-N      ___________________________________________________    Time of Evaluation and Treatment:  Time Calculation  PT Received On: 11/02/21  Start Time: 0903  Stop Time: 0923  Time Calculation (min): 20 min    Evaluation Time: 10 minutes  Treatment Time: 10 minutes    Chart Review and Collaboration with Care Team: 6 minutes, not included in above time    PT Visit Number: 1    Consult received for [name] for PT Evaluation and Treatment.  Patient's medical condition is appropriate for Physical therapy intervention at this time.    Activity Orders:  PT eval and treat and activity as tolerated    Precautions and Contraindications:  Precautions  Weight Bearing Status: no restrictions  Fall Risks: History of fall(s), Impaired balance/gait, Impaired mobility  Other Precautions: PMHx CVAs    Personal Protective Equipment (PPE)  gloves, procedure mask, shoe covers, and pt wore procedure mask    Medical Diagnosis:  Balance problem [R26.89]    History of Present Illness:  [name] is a 60 y.o. female admitted on 11/01/2021 with past medical history of CVA currently staying in a shelter brought to the ED by EMS for evaluation of inability to stand or walk.  Patient states that he choked on a first bite of oatmeal this  morning and is currently followed by feeling lightheaded and generalized weakness.  She she reports that she could not move lower half of her body at all for which EMS was called and brought to the ED.  Patient states that before she has intermittent periods of syncope where she found herself on the floor of her kitchen and was not sure how that happened.  She endorses difficulty of maintaining her balance intermittent lightheadedness over the last 2 months.  She also complains of pain all over her body.  She states that left side of her body is weaker, from previous CVA.  The only medication that she takes at home is Tylenol.  Patient states that "I am  petrified "as she was getting these threats from her husband.  Patient also states that her field of vision is narrowed the last couple of months.  At the ED her blood pressure is ranging from 93/67-1 42/69.  Review of blood work essentially unremarkable.  Tested negative for COVID-19 and influenza.  CT head without contrast no acute abnormality.  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     Past Surgical History:   Procedure Laterality Date    LEG SURGERY Left     Plate placed lower extremmity.       X-Rays/Tests/Labs:  Lab Results   Component Value Date/Time    HGB 11.8 11/02/2021 04:09 AM    HCT 36.5 11/02/2021 04:09 AM    K 4.1 11/02/2021 04:09 AM    NA 144 11/02/2021 04:09 AM    INR 1.0 11/02/2021 04:09 AM    TROPI <0.01 09/12/2020 11:10 PM    TROPI <0.01 09/12/2020 08:09 PM    TROPI <0.01 07/07/2020 12:58 PM       All imaging reviewed, please see chart for details.    Social History:  Prior Level of Function  Prior level of function:  Ambulates independently, Independent with ADLs  Baseline Activity Level: Household ambulation, Community ambulation  Driving: does not drive  DME Currently at Home:  (none)    Home Living Arrangements  Living Arrangements: Alone  Type of Home:  (shelter)  DME Currently at Home:  (none)  Home Living - Notes / Comments: Pt reports that she plans to transition from the shelter into a motel.      Subjective:  Patient is agreeable to participation in the therapy session. Nursing clears patient for therapy.     Pain Assessment  Pain Assessment: No/denies pain      Objective:  Observation of Patient/Vital Signs:    Vitals: Supine at rest    11/02/21   BP: 120/81   Pulse: (!) 59   Resp: 18   Temp: 97.7 F (36.5 C)   SpO2: 95%              Cognitive Status and Neuro Exam:  Cognition/Neuro Status  Arousal/Alertness: Appropriate responses to stimuli  Attention Span: Appears intact  Orientation Level: Oriented X4  Memory: Appears intact  Following Commands: Follows all commands and directions without difficulty  Safety Awareness: minimal verbal instruction  Problem Solving: supervision  Behavior: attentive;calm;cooperative  Motor Planning: intact  Coordination: intact    Musculoskeletal Examination  Gross ROM  Right Upper Extremity ROM: within functional limits  Left Upper Extremity ROM: within functional limits  Right Lower Extremity ROM: within functional limits  Left Lower Extremity ROM: within functional limits  Gross Strength  Right Upper Extremity Strength: 3+/5  Left Upper Extremity Strength: 3+/5  Right Lower Extremity Strength: 4-/5  Left Lower Extremity Strength: 4-/5       Functional Mobility:  Functional Mobility  Supine to Sit: Stand by Assist;Increased Time;Increased Effort;using bedrail;HOB raised;to Right  Scooting to HOB: Modified independent  Scooting to EOB: Modified Independent  Sit to Supine: Stand by Assist  Sit to Stand: Contact Guard Assist;Increased Time;Increased Effort;using bedrail;bed elevated;with  instruction for hand placement to increase safety (w/ RW)  Stand to Sit: Contact Guard Assist     Locomotion  Ambulation: Contact Guard Assist;with front-wheeled walker (83ft)  Pattern: Wide BOS;Step through;decreased step length;decreased cadence     Balance  Balance  Sitting - Static: Good  Sitting - Dynamic: Good  Standing - Static: Good (w/ RW)  Standing - Dynamic: Fair (w/ RW)    Participation and Activity Tolerance  Participation and Endurance  Participation Effort: good  Endurance: Tolerates < 10 min exercise, no significant change in vital signs  [place] Dyspnea Scale: 0 Dyspnea    Educated  the Patient to role of physical therapy, plan of care, goals of therapy and safety with mobility and ADLs, energy conservation techniques, pursed lip breathing, home safety with verbalized understanding  and demonstrated understanding.    Patient left in bed with alarm and all other medical equipment in place and call bell and all personal items/needs within reach.  RN notified of session outcome.      Assessment:  [name] is a 60 y.o. female admitted 11/01/2021.  PT Assessment  Assessment: Gait impairment;Decreased balance;Decreased functional mobility;Decreased LE strength;Decreased UE strength;Decreased safety/judgement during functional mobility;Decreased endurance/activity tolerance  Prognosis: Good;With continued PT status post acute discharge;Ongoing PT assessment needed  Progress: Slow progress, decreased activity tolerance      Treatment: Performed ambulation as noted above with no major LOB noted however pt was limited by increased fatigue. She was  provided education on breathing techniques and pacing to maximize her overall activity tolerance. She also required increased verbal facilitation for upright posture and gait mechanics to maximize safety.  Pt was also able to safely ambulate to the bathroom and performed all personal care needs w/ mod I.  All OOB mobility performed with use of  gait belt for increased pt and therapist safety. Vitals were assessed throughout session and remained WNLs.  Reviewed PT plan of care and D/C recommendation. Reinforced  use of nursing assistance for safety and use of call bell. Pt verbalized understanding and agreement with plan.  Addressed all pt questions and concerns.    Plan:  Treatment/Interventions: Continued evaluation, Compensatory technique education, Bed mobility, Equipment eval/education, Patient/family training, Endurance training, LE strengthening/ROM, Functional transfer training, Neuromuscular re-education, Gait training, Exercise  PT Frequency: 1-2x/wk  Risks/Benefits/POC Discussed with Pt/Family: With patient    PMP Activity: Step 7 - Walks out of Room  Distance Walked (ft) (Step 6,7): 60 Feet      Goals:  Goals  Goal Formulation: With patient  Time for Goal Acheivement: By time of discharge  Goals: Select goal  Pt Will Go Supine To Sit: modified independent, to maximize functional mobility and independence, Partly met  Pt Will Perform Sit To Supine: modified independent, to maximize functional mobility and independence, Partly met  Pt Will Perform Sit to Stand: modified independent, to maximize functional mobility and independence, Partly met (w/ RW)  Pt Will Transfer Bed/Chair: with rolling walker, modified independent, to maximize functional mobility and independence  Pt Will Ambulate: 101-150 feet, with rolling walker, modified independent, to maximize functional mobility and independence, Partly met    Carney Living, PT, DPT  Physical Therapist  Physical Medicine & Rehabilitation  [phone]  Mon-Fri 6:30-3pm  11/02/2021 9:54 AM               [facility]  Patient: [name] MRN#: [id number]  Unit: [facility] Bed: M0102/V2536-U

## 2021-11-02 NOTE — Progress Notes (Signed)
Initial Case Management Assessment and Discharge Planning  [facility]   Patient Name: [name], [name]   Date of Birth 1960/12/23   Attending Physician: Adam Phenix,*   Primary Care Physician: Pcp, None, MD   Length of Stay 0   Reason for Consult / Chief Complaint Initial Assessment         Situation   Admission DX:   1. Lightheadedness    2. Balance problem    3. Tension-type headache, not intractable, unspecified chronicity pattern        A/O Status: X 3    LACE Score: 6    Patient admitted from: ER  Admission Status: observation       Background     Advanced directive:   <no information>    Code Status:   Full Code     Residence: Other: homeless shelter    PCP: PCP None, MD  Patient Contact:   [phone] (home)     [phone] (mobile)     Emergency contact:   Extended Emergency Contact Information  Primary Emergency Contact: Weyman Pedro  Mobile Phone: [phone]  Relation: Other  Preferred language: English  Interpreter needed? No      ADL/IADL's: Independent  Previous Level of function: 7 Independent     DME: None    Pharmacy:     [facility] [address], [place] - [address]  [address]  [place] [address]  Phone: [phone] Fax: [phone]      Prescription Coverage: No    Home Health: The patient is not currently receiving home health services.    COVID Vaccine Status: unknown    Transport for discharge? Mode of transportation: Taxi  Agreeable to homeless shelter post-discharge:  Yes     Assessment   CM met Pt at bedside. Pt confirmed all information on facesheet was correct. Pt has a bed available at homeless shelter on [address]. Homeless shelter does not open up til 7pm so Pt is requesting to [place] at that time to shelter. Pt has no insurance or PCP and no plans to get insurance. Discussed [facility] with Pt. 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 Pt currently has no money and will get  paid on the 28th of Nov. Pt will need assistance will transportation back to homeless shelter.   BARRIERS TO DISCHARGE: medically stability     Recommendation   D/C Plan A: [place] to homeless shelter.

## 2021-11-02 NOTE — Progress Notes (Signed)
CM met Pt as Discussed [place] plan. Pt will go back to homeless Address:    [facility] shelter in [place], [place]  [address] [place], [place], [place] [address]    CM gave Pt address to [facility] and RX coupon.     [employer] set up for 1830 pickup.    Clint Guy, MSW  [facility]  Case Manager Social Worker I

## 2021-11-02 NOTE — Discharge Summary -  Nursing (Signed)
Pt is AOx4, NSR, RA. Tele d/c and IV removed, intact upon removal. D/c instructions reviewed with pt. Pt verbalized understanding of instructions and plans to comply. [employer] set up for 1830 pickup.

## 2021-11-02 NOTE — Progress Notes (Signed)
11/02/21 1000   Professional Chaplain   Visit Type Initial   Source Patient Request   Present at Visit Patient   Spiritual Care Provided to patient   Reason for Visit Spiritual Support   Spiritual Assessment Overwhelmed;Anxious/fearful emotions   Spiritual Care Interventions Provided reflective and compassionate listening;Explored anxiety and fear;Provided emotional support;Provided prayer   Spiritual Care Outcomes Patient expressed appreciation of visit   Length of Visit 16-30 minutes   Follow-up No follow-up at this time

## 2021-11-02 NOTE — Discharge Summary (Signed)
Discharge Summary    Date:11/02/2021   Patient Name: [name]  Attending Physician: [name],*    Date of Admission:   11/01/2021    Date of Discharge:   11/02/2021    Admitting Diagnosis:   Balance issue    Discharge Dx:     Principal Diagnosis (Diagnosis after study, that is chiefly responsible for admission to inpatient status): Balance problem  Active Hospital Problems    Diagnosis POA    Principal Problem: Balance problem Yes    Depression Yes     Chronic      Resolved Hospital Problems   No resolved problems to display.       Treatment Team:   Treatment Team:   Attending Provider: Adam Phenix, MD   Consulting Provider: Ardelle Anton, MD    Procedures performed:   Radiology: all results in the last 7 days  CT Abd/Pelvis without Contrast    Result Date: 11/01/2021   NO ACUTE ABNORMALITY. Shelia Maffucci, MD  11/01/2021 10:06 AM    XR Chest 2 Views    Result Date: 11/01/2021       NO SIGNIFICANT ABNORMALITY. Shelia Maffucci, MD  11/01/2021 9:19 AM    CT Head without Contrast    Result Date: 11/01/2021      NO ACUTE INTRACRANIAL ABNORMALITY. Shelia Maffucci, MD  11/01/2021 9:58 AM    CT Head without Contrast    Result Date: 10/30/2021   No hemorrhage or acute process. Shelia Nay, MD  10/30/2021 9:11 PM    MRI Brain WO Contrast    Result Date: 11/02/2021   No acute intracranial abnormality within limitations of motion degraded study. [name]  11/02/2021 8:38 AM    Chest AP Only    Result Date: 10/30/2021  No acute process. Shelia Guest, MD  10/30/2021 12:00 AM     Reason for Admission:   Per H&P note:  [name] is a 60 y.o. female with past medical history of CVA currently staying in a shelter brought to the ED by EMS for evaluation of inability to stand or walk.  Patient states that he choked on a first bite of oatmeal this morning and is currently followed by feeling lightheaded and generalized weakness.  She she reports that she could not move lower half of her body at  all for which EMS was called and brought to the ED.  Patient states that before she has intermittent periods of syncope where she found herself on the floor of her kitchen and was not sure how that happened.  She endorses difficulty of maintaining her balance intermittent lightheadedness over the last 2 months.  She also complains of pain all over her body.  She states that left side of her body is weaker, from previous CVA.  The only medication that she takes at home is Tylenol.  Patient states that "I am  petrified "as she was getting these threats from her husband.  Patient also states that her field of vision is narrowed the last couple of months.  At the ED her blood pressure is ranging from 93/67-1 42/69.  Review of blood work essentially unremarkable.  Tested negative for COVID-19 and influenza.  CT head without contrast no acute abnormality.  CT abdomen/pelvis unremarkable  '  Hospital Course:   Pt was admitted to telemetry bed with frequent neuro checks. She remained uneventful without recurrence of presenting symptoms. Vitals remained stable and all blood work has been neg. She was  evaluated by neurology consult and rec brain MRI that came back unremarkable as well. Pt was cleared by neurology for discharge. PT recommended front wheel on discharge. Pt is currently being discharged in stable condition.     Condition at Discharge:   Stable    Today:     BP 132/71   Pulse 77   Temp 98.4 F (36.9 C) (Oral)   Resp 18   Ht 1.626 m (5\' 4" )   Wt 108.7 kg (239 lb 11.2 oz)   SpO2 94%   BMI 41.14 kg/m   Ranges for the last 24 hours:  Temp:  [97.7 F (36.5 C)-99.6 F (37.6 C)] 98.4 F (36.9 C)  Heart Rate:  [59-86] 77  Resp Rate:  [18] 18  BP: (91-132)/(55-83) 132/71    Last set of labs   Recent Labs   Lab 11/02/21  0409   WBC 5.60   Hgb 11.8   Hematocrit 36.5   Platelets 137*     Recent Labs   Lab 11/02/21  0409   Sodium 144   Potassium 4.1   Chloride 113*   CO2 25   BUN 17.0   Creatinine 0.6   EGFR >60.0    Glucose 120*   Calcium 8.5     Recent Labs   Lab 11/01/21  1354 11/01/21  0828   Bilirubin, Total  --  0.5   Protein, Total 5.7* 6.1   Albumin  --  3.5   ALT  --  16   AST (SGOT)  --  17     Recent Labs   Lab 11/02/21  0409   PT 11.4   PT INR 1.0   PTT 39           Invalid input(s): FREET4  Recent Labs   Lab 11/02/21  0409   Cholesterol 169   Triglycerides 126   HDL 33*   LDL Calculated 111*           Micro / Labs / Path pending:     Unresulted Labs       Procedure . . . Date/Time    US CAROTID DUPLEX DOPP COMP [[id number]] Resulted: 11/02/21 1223     Updated: 11/02/21 1306    Whole Blood Vitamin B1 (Thiamine) [[id number]] Collected: 11/02/21 0409    Specimen: Blood Updated: 11/02/21 0417    Narrative:      Transfer whole blood to plastic shipping vial. Wrap tube in aluminum foil to  protect from light. Freeze immediatley.            Discharge Instructions     Stay well hydrated.       Discharge Instructions:      Follow-up Information       Pcp, None, MD .                             Discharge Diet: Regular Diet  Wound 07/18/20 Moisture Associated Skin Damage (MASD) Breast;Abdomen;Groin Red, moist, rash (Active)   Number of days: 472        Peripheral IV 11/01/21 20 G Left Antecubital (Active)   Site Assessment Clean;Dry;Intact 11/02/21 0800   Line Status Infusing 11/02/21 0800   Dressing Status Clean;Dry;Intact 11/02/21 0800   Dressing Change Due 11/07/21 11/02/21 0800   Reason Not Rotated Not due 11/02/21 0800   Number of days: 1     Disposition:  Home or Self Care  Discharge Medication List        Taking      acetaminophen 500 MG tablet  Dose: 500 mg  Commonly known as: TYLENOL  Take 500 mg by mouth as needed for Pain     dicyclomine 10 MG capsule  Dose: 10 mg  Commonly known as: BENTYL  For: abdominal pain  Take 1 capsule (10 mg total) by mouth every 6 (six) hours as needed (abdominal pain)            Minutes spent coordinating discharge and reviewing discharge plan: 35 minutes      Signed by: Shelia Phenix, MD

## 2021-11-02 NOTE — Plan of Care (Signed)
NURSING SHIFT NOTE     Patient: [name]  Day: 0      SHIFT EVENTS     Shift Narrative/Significant Events (PRN med administration, fall, RRT, etc.):     Patient AAOX4 on RA  NIHSS and Q4H Neuro check completed  Patient complaining of general pain, some muscle spasms that she stated she periodically has  Patient went for an ultrasound of the carotid   MD assessed patient and placed an order for D/C  Patient to be D/C back to shelter by taxi     Safety and fall precautions remain in place. Purposeful rounding completed.          ASSESSMENT     Changes in assessment from patient's baseline this shift:    Neuro: No  CV: No  Pulm: No  Peripheral Vascular: No  HEENT: No  GI: No  BM during shift: No, Last BM: Last BM Date: 10/31/21  GU: No   Integ: No  MS: No    Pain:  general pain  Pain Interventions: Rest and Positioning  Medications Utilized: SEE MAR    Mobility: PMP Activity: Step 7 - Walks out of Room of Distance Walked (ft) (Step 6,7): 60 Feet           Lines     Patient Lines/Drains/Airways Status       Active Lines, Drains and Airways       Name Placement date Placement time Site Days    Peripheral IV 11/01/21 20 G Left Antecubital 11/01/21  0815  Antecubital  1                         VITAL SIGNS     Vitals:    11/02/21 1212   BP: 132/71   Pulse: 77   Resp: 18   Temp: 98.4 F (36.9 C)   SpO2: 94%       Temp  Min: 97.7 F (36.5 C)  Max: 99.6 F (37.6 C)  Pulse  Min: 58  Max: 86  Resp  Min: 16  Max: 18  BP  Min: 91/55  Max: 132/71  SpO2  Min: 93 %  Max: 98 %      Intake/Output Summary (Last 24 hours) at 11/02/2021 1224  Last data filed at 11/01/2021 1506  Gross per 24 hour   Intake 240 ml   Output --   Net 240 ml            CARE PLAN        Problem: Safety  Goal: Patient will be free from injury during hospitalization  Outcome: Progressing  Flowsheets (Taken 11/02/2021 1222)  Patient will be free from injury during hospitalization:   Assess patient's risk for falls and implement fall prevention plan of  care per policy   Provide and maintain safe environment   Ensure appropriate safety devices are available at the bedside   Use appropriate transfer methods   Include patient/ family/ care giver in decisions related to safety   Hourly rounding  Goal: Patient will be free from infection during hospitalization  Outcome: Progressing  Flowsheets (Taken 11/02/2021 1222)  Free from Infection during hospitalization:   Assess and monitor for signs and symptoms of infection   Monitor lab/diagnostic results   Encourage patient and family to use good hand hygiene technique     Problem: Pain  Goal: Pain at adequate level as identified by patient  Outcome: Progressing  Flowsheets (  Taken 11/02/2021 1222)  Pain at adequate level as identified by patient:   Identify patient comfort function goal   Assess for risk of opioid induced respiratory depression, including snoring/sleep apnea. Alert healthcare team of risk factors identified.   Assess pain on admission, during daily assessment and/or before any "as needed" intervention(s)   Reassess pain within 30-60 minutes of any procedure/intervention, per Pain Assessment, Intervention, Reassessment (AIR) Cycle   Evaluate if patient comfort function goal is met   Evaluate patient's satisfaction with pain management progress   Offer non-pharmacological pain management interventions     Problem: Psychosocial and Spiritual Needs  Goal: Demonstrates ability to cope with hospitalization/illness  Outcome: Progressing  Flowsheets (Taken 11/02/2021 1222)  Demonstrates ability to cope with hospitalizations/illness:   Encourage verbalization of feelings/concerns/expectations   Provide quiet environment   Assist patient to identify own strengths and abilities   Encourage patient to set small goals for self   Encourage participation in diversional activity   Reinforce positive adaptation of new coping behaviors     Problem: Moderate/High Fall Risk Score >5  Goal: Patient will remain free of  falls  Outcome: Progressing  Flowsheets (Taken 11/02/2021 1222)  Moderate Risk (6-13):   LOW-Fall Interventions Appropriate for Low Fall Risk   LOW-Anticoagulation education for injury risk   MOD-Consider activation of bed alarm if appropriate   MOD-Apply bed exit alarm if patient is confused   MOD-Floor mat at bedside (where available) if appropriate   MOD-Consider a move closer to Nurses Station   MOD-Remain with patient during toileting   MOD-Utilize diversion activities   MOD-Perform dangle, stand, walk (DSW) prior to mobilization     Problem: Every Day - Stroke  Goal: Core/Quality measure requirements - Daily  Outcome: Progressing  Flowsheets (Taken 11/02/2021 1222)  Core/Quality measure requirements - Daily:   VTE Prevention: Ensure anticoagulant(s) administered and/or anti-embolism stockings/devices documented by end of day 2   Ensure antithrombotic administered or contraindication documented by LIP by end of day 2   Continue stroke education (must include Modifiable Risk Factors, Warning Signs and Symptoms of Stroke, Activation of Emergency Medical System and Follow-up Appointments). Ensure handout has been given and documented.   Once lipid panel has resulted, check LDL. Contact provider for statin order if LDL > 70 (or ensure contraindication documented by LIP).  Goal: Neurological status is stable or improving  Outcome: Progressing  Flowsheets (Taken 11/02/2021 1222)  Neurological status is stable or improving:   Monitor/assess/document neurological assessment (Stroke: every 4 hours)   Monitor/assess NIH Stroke Scale   Re-assess NIH Stroke Scale for any change in status   Observe for seizure activity and initiate seizure precautions if indicated   Perform CAM Assessment  Goal: Stable vital signs and fluid balance  Outcome: Progressing  Flowsheets (Taken 11/02/2021 1222)  Stable vital signs and fluid balance:   Position patient for maximum circulation/cardiac output   Monitor and assess vitals every 4  hours or as ordered and hemodynamic parameters   Monitor intake and output. Notify LIP if urine output is < 30 mL/hour.   Encourage oral fluid intake   Apply telemetry monitor as ordered  Goal: Patient will maintain adequate oxygenation  Outcome: Progressing  Flowsheets (Taken 11/02/2021 1222)  Patient will maintain adequate oxygenation:   Suction secretions as needed   Maintain SpO2 of greater than 92%  Goal: Patient's risk of aspiration will be minimized  Outcome: Progressing  Flowsheets (Taken 11/02/2021 1222)  Patient's risk of aspiration will be minimized:  Place swallow precaution signage above bed   Monitor/assess for signs of aspiration (tachypnea, cough, wheezing, clearing throat, hoarseness after eating, decrease in SaO2   Assess and monitor ability to swallow   Keep head of bed up a minimum of 30 degrees when hemodynamically stable   HOB up 90 degrees to eat if unable to be OOB   Place patient up in chair to eat, if possible  Goal: Mobility/Activity is maintained at optimal level for patient  Outcome: Progressing  Flowsheets (Taken 11/02/2021 1222)  Mobility/activity is maintained at optimal level for patient:   Increase mobility as tolerated/progressive mobility   Encourage independent activity per ability   Maintain proper body alignment   Perform active/passive ROM   Plan activities to conserve energy, plan rest periods   Reposition patient every 2 hours and as needed unless able to reposition self   Assess for changes in respiratory status, level of consciousness and/or development of fatigue

## 2021-11-04 ENCOUNTER — Emergency Department: Payer: Self-pay

## 2021-11-04 ENCOUNTER — Emergency Department
Admission: EM | Admit: 2021-11-04 | Discharge: 2021-11-04 | Disposition: A | Discharge status: Home / Self Care | Payer: Self-pay | Attending: Emergency Medicine | Admitting: Emergency Medicine

## 2021-11-04 DIAGNOSIS — R079 Chest pain, unspecified: Secondary | ICD-10-CM

## 2021-11-04 DIAGNOSIS — Z760 Encounter for issue of repeat prescription: Secondary | ICD-10-CM | POA: Insufficient documentation

## 2021-11-04 DIAGNOSIS — G8929 Other chronic pain: Secondary | ICD-10-CM | POA: Insufficient documentation

## 2021-11-04 DIAGNOSIS — M545 Low back pain, unspecified: Secondary | ICD-10-CM | POA: Insufficient documentation

## 2021-11-04 LAB — ECG 12-LEAD
Atrial Rate: 69 {beats}/min
IHS MUSE NARRATIVE AND IMPRESSION: NORMAL
P Axis: -18 degrees
P-R Interval: 120 ms
Q-T Interval: 360 ms
QRS Duration: 70 ms
QTC Calculation (Bezet): 385 ms
R Axis: -11 degrees
T Axis: 38 degrees
Ventricular Rate: 69 {beats}/min

## 2021-11-04 LAB — CBC AND DIFFERENTIAL
Absolute NRBC: 0 10*3/uL (ref 0.00–0.00)
Basophils Absolute Automated: 0.04 10*3/uL (ref 0.00–0.08)
Basophils Automated: 0.6 %
Eosinophils Absolute Automated: 0.17 10*3/uL (ref 0.00–0.44)
Eosinophils Automated: 2.7 %
Hematocrit: 40.3 % (ref 34.7–43.7)
Hgb: 13.3 g/dL (ref 11.4–14.8)
Immature Granulocytes Absolute: 0.01 10*3/uL (ref 0.00–0.07)
Immature Granulocytes: 0.2 %
Lymphocytes Absolute Automated: 2.01 10*3/uL (ref 0.42–3.22)
Lymphocytes Automated: 32.2 %
MCH: 31.8 pg (ref 25.1–33.5)
MCHC: 33 g/dL (ref 31.5–35.8)
MCV: 96.4 fL — ABNORMAL HIGH (ref 78.0–96.0)
MPV: 9.8 fL (ref 8.9–12.5)
Monocytes Absolute Automated: 0.53 10*3/uL (ref 0.21–0.85)
Monocytes: 8.5 %
Neutrophils Absolute: 3.48 10*3/uL (ref 1.10–6.33)
Neutrophils: 55.8 %
Nucleated RBC: 0 /100 WBC (ref 0.0–0.0)
Platelets: 151 10*3/uL (ref 142–346)
RBC: 4.18 10*6/uL (ref 3.90–5.10)
RDW: 13 % (ref 11–15)
WBC: 6.24 10*3/uL (ref 3.10–9.50)

## 2021-11-04 LAB — COMPREHENSIVE METABOLIC PANEL
ALT: 16 U/L (ref 0–55)
AST (SGOT): 17 U/L (ref 5–41)
Albumin/Globulin Ratio: 1.3 (ref 0.9–2.2)
Albumin: 3.6 g/dL (ref 3.5–5.0)
Alkaline Phosphatase: 89 U/L (ref 37–117)
Anion Gap: 8 (ref 5.0–15.0)
BUN: 16 mg/dL (ref 7.0–21.0)
Bilirubin, Total: 0.4 mg/dL (ref 0.2–1.2)
CO2: 24 mEq/L (ref 17–29)
Calcium: 8.7 mg/dL (ref 8.5–10.5)
Chloride: 111 mEq/L (ref 99–111)
Creatinine: 0.7 mg/dL (ref 0.4–1.0)
Globulin: 2.8 g/dL (ref 2.0–3.6)
Glucose: 140 mg/dL — ABNORMAL HIGH (ref 70–100)
Potassium: 4.2 mEq/L (ref 3.5–5.3)
Protein, Total: 6.4 g/dL (ref 6.0–8.3)
Sodium: 143 mEq/L (ref 135–145)

## 2021-11-04 LAB — GFR: EGFR: >60

## 2021-11-04 LAB — LIPASE: Lipase: 12 U/L (ref 8–78)

## 2021-11-04 MED ORDER — KETOROLAC TROMETHAMINE 15 MG/ML IJ SOLN
15.0000 mg | Freq: Once | INTRAMUSCULAR | Status: AC
Start: 2021-11-04 — End: 2021-11-04
  Administered 2021-11-04: 15 mg via INTRAVENOUS

## 2021-11-04 MED ORDER — ACETAMINOPHEN 500 MG PO TABS
1000.0000 mg | ORAL_TABLET | Freq: Once | ORAL | Status: AC
Start: 2021-11-04 — End: 2021-11-04
  Administered 2021-11-04: 1000 mg via ORAL
  Filled 2021-11-04: qty 2

## 2021-11-04 MED ORDER — KETOROLAC TROMETHAMINE 15 MG/ML IJ SOLN
15.0000 mg | Freq: Once | INTRAMUSCULAR | Status: DC
Start: 2021-11-04 — End: 2021-11-04
  Filled 2021-11-04: qty 1

## 2021-11-04 MED ORDER — ACETAMINOPHEN 500 MG PO TABS
500.0000 mg | ORAL_TABLET | ORAL | 0 refills | Status: AC | PRN
Start: 2021-11-04 — End: ?

## 2021-11-04 MED ORDER — LIDOCAINE 5 % EX PTCH
2.0000 | MEDICATED_PATCH | CUTANEOUS | Status: DC
Start: 2021-11-04 — End: 2021-11-05
  Administered 2021-11-04: 2 via TRANSDERMAL
  Filled 2021-11-04: qty 2

## 2021-11-04 MED ORDER — IBUPROFEN 600 MG PO TABS
600.0000 mg | ORAL_TABLET | Freq: Four times a day (QID) | ORAL | 0 refills | Status: AC | PRN
Start: 2021-11-04 — End: 2021-12-04

## 2021-11-04 MED ORDER — DICYCLOMINE HCL 10 MG PO CAPS
10.0000 mg | ORAL_CAPSULE | Freq: Four times a day (QID) | ORAL | 0 refills | Status: AC | PRN
Start: 2021-11-04 — End: ?

## 2021-11-04 NOTE — ED Triage Notes (Addendum)
Pt from home with c/o back pain and abdominal pain that began x1 week ago. Pt states last night falling face down. Pt denies LOC. No visible bruising to face. Pt states emesis with blood, dizziness, chills. Pt states bleeding was eraser sized clots and bright red. Pt states pain is 8/10 and feels like pressure. Pt denies taking medication prior to arrival.

## 2021-11-04 NOTE — Discharge Instructions (Signed)
Your CT imaging today shows no bony injury to your head or spine. Your medications were refilled and paper prescriptions were given to you.       For you back pain, you received tylenol, toradol, and lidocaine patches.

## 2021-11-04 NOTE — ED Notes (Signed)
Bed: YE6  Expected date:   Expected time:   Means of arrival:   Comments:

## 2021-11-05 NOTE — Discharge Instr - AVS First Page (Addendum)
Reason for your Hospital Admission:  ***      Instructions for after your discharge:  ***                  Home Health Referral      Referral from [place] (Case Manager) for       [employer]:  Name of DME Agency: [facility]] [phone]) Rolling walker to be delivered to patients room.      The above services were set up by:  Marcheta Grammes, RN [facility] Liaison) Phone [phone]      Comment:[facility] Case Management has approved for a Rolling walker.

## 2021-11-05 NOTE — Progress Notes (Signed)
Name of DME agency:    [facility] [employer]            Phone #:        [phone]         Fax #:      [phone]              Note:     Front Wheel Walker delivered to pt's hospital room by CM. Approved through System Case Management (SCM).

## 2021-11-05 NOTE — Progress Notes (Signed)
Home Health Referral      Referral from [place] (Case Manager) for       [employer]:  Name of DME Agency: [facility]] [phone]) Rolling walker to be delivered to patients room.      The above services were set up by:  Marcheta Grammes, RN [facility] Liaison) Phone [phone]      Comment:[facility] Case Management has approved for a Rolling walker.      Signed by: Marcheta Grammes, RN  Date Time: 11/05/21 11:47 AM
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
## 2021-11-07 LAB — VITAMIN B1, WHOLE BLOOD: Whole Blood Vitamin B1: 247 nmol/L — ABNORMAL HIGH (ref 78–185)

## 2021-11-09 ENCOUNTER — Emergency Department
Admission: EM | Admit: 2021-11-09 | Discharge: 2021-11-09 | Disposition: A | Discharge status: Home / Self Care | Payer: Self-pay | Attending: Emergency Medicine | Admitting: Emergency Medicine

## 2021-11-09 DIAGNOSIS — Z5901 Sheltered homelessness: Secondary | ICD-10-CM | POA: Insufficient documentation

## 2021-11-09 DIAGNOSIS — B356 Tinea cruris: Secondary | ICD-10-CM

## 2021-11-09 DIAGNOSIS — M549 Dorsalgia, unspecified: Secondary | ICD-10-CM | POA: Insufficient documentation

## 2021-11-09 DIAGNOSIS — G8929 Other chronic pain: Secondary | ICD-10-CM | POA: Insufficient documentation

## 2021-11-09 DIAGNOSIS — M545 Low back pain, unspecified: Secondary | ICD-10-CM

## 2021-11-09 LAB — URINALYSIS REFLEX TO MICROSCOPIC EXAM - REFLEX TO CULTURE
Blood, UA: NEGATIVE
Glucose, UA: NEGATIVE
Ketones UA: 5 — AB
Leukocyte Esterase, UA: NEGATIVE
Nitrite, UA: NEGATIVE
Protein, UR: 100 — AB
Specific Gravity UA: 1.035 (ref 1.001–1.035)
Urine pH: 5 (ref 5.0–8.0)
Urobilinogen, UA: 2 mg/dL (ref 0.2–2.0)

## 2021-11-09 MED ORDER — LIDOCAINE 5 % EX PTCH
1.0000 | MEDICATED_PATCH | CUTANEOUS | 0 refills | Status: DC
Start: 2021-11-09 — End: 2021-11-25

## 2021-11-09 MED ORDER — CLOTRIMAZOLE 1 % EX CREA
TOPICAL_CREAM | Freq: Two times a day (BID) | CUTANEOUS | 0 refills | Status: AC
Start: 2021-11-09 — End: ?

## 2021-11-09 MED ORDER — LIDOCAINE 5 % EX PTCH
1.0000 | MEDICATED_PATCH | CUTANEOUS | Status: DC
Start: 2021-11-09 — End: 2021-11-09
  Administered 2021-11-09: 1 via TRANSDERMAL
  Filled 2021-11-09: qty 1

## 2021-11-09 MED ORDER — NAPROXEN 250 MG PO TABS
375.0000 mg | ORAL_TABLET | Freq: Two times a day (BID) | ORAL | 0 refills | Status: AC
Start: 2021-11-09 — End: ?

## 2021-11-09 NOTE — ED Triage Notes (Signed)
Pt ambulatory to triage reporting generalized back pain from top to bottom. Pt also reports a possible yeast infection under her skin folds. Pt states that her sx have been ongoing since 11/21. Throughout triage pt endorsed numerous other complaints.     PT appears to be in no distress during triage. Pt is requesting a lidociane patch.
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
## 2021-11-09 NOTE — ED Provider Notes (Signed)
ED PHYSICIAN ASSIGNED       Date/Time Event User Comments    11/09/21 1849 Physician Assigned [facility], Daryll Drown. Army Fossa, DO assigned as Attending             History     Chief Complaint   Patient presents with    Back Pain    Rash       History obtained from: Patient, prior medical records    60 year old female who presents to the ED with chronic generalized pain as well as rash.  Patient reports onset of symptoms on November 21 after she was evicted from her house.  She reports pain all over her body but worse in her back.  Back pain has been ongoing for months but worsened recently.  She has been using Lidoderm patches and NSAIDs.  She also reports rash underneath bilateral breasts and in her groin.  The rash is itchy in nature.  She has been seen in multiple ERs multiple times for back pain and multiple other complaints.  The patient notes that she is currently homeless and staying in a shelter.  Denies any fevers.       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       Past Surgical History:   Procedure Laterality Date    LEG SURGERY Left     Plate placed lower extremmity.       No family history on file.      Social  Social History     Tobacco Use    Smoking status: Never    Smokeless tobacco: Never    Tobacco comments:     no use of cigarettes in the past 30 days    Vaping Use    Vaping Use: Never used   Substance Use Topics    Alcohol use: Yes     Comment: celebratory, No alcohol use in the past 12 months     Drug use: Never     Comment: no use of substance in the past 30 days        .     No Known Allergies    Home Medications               acetaminophen (TYLENOL) 500 MG tablet     Take 1 tablet (500 mg) by mouth every 4 (four) hours as needed for Pain     dicyclomine (BENTYL) 10 MG capsule     Take 1 capsule (10 mg) by mouth every 6 (six) hours as needed  (abdominal pain)     ibuprofen (ADVIL) 600 MG tablet     Take 1 tablet (600 mg) by mouth every 6 (six) hours as needed for Pain or Fever      Ongoing Comment    [name], RN    07/07/2020  7:24 PM     Takes no meds.                 Past medical, surgical, and social history reviewed and confirmed.     Review of Systems      In addition to that documented in the HPI above, the additional ROS was obtained:  Constitutional: Denies fevers or chills  Eyes: Denies vision changes  ENT: Denies sore throat  CV: Denies chest pain or palpitations  Resp: Denies SOB, cough  GI: Denies vomiting, diarrhea. + abdominal pain  GU: Denies painful urination, hematuria  MSK: + back pain  Skin: + rash  Neuro: Denies syncope. + generalized weakness, balance issues  Endocrine: Denies unexpected weight loss or gain  Heme: Denies bleeding disorders. Denies immunocompromise      Physical Exam    BP: 118/80, Heart Rate: 98, Temp: 98.5 F (36.9 C), Resp Rate: 20, SpO2: 95 %, Weight: 108.4 kg    Vitals:    11/09/21 1824 11/09/21 1940   BP: 118/80 124/82   Pulse: 98 92   Resp: 20 19   Temp: 98.5 F (36.9 C)    TempSrc: Oral    SpO2: 95% 97%   Weight: 108.4 kg    Height: 5\' 4"  (1.626 m)         Vitals and available nursing notes reviewed.      Physical Exam    CONSTITUTIONAL:  no acute distress, nontoxic appearing  EYES:  no scleral icterus. no conjunctival erythema  HEAD:  atraumatic, normocephalic  ENT:   no facial swelling, no nasal deformity. Airway patent.   NECK:  no swelling, trachea midline  CARDIOVASCULAR:  regular rate and rhythm  PULMONARY/CHEST: lungs clear, no respiratory distress. Erythematous rash underneath bilateral breasts  ABDOMEN:  soft, nontender, no distention  BACK: nonfocal tenderness to palpation. No step off or deformity  MUSCULOSKELETAL: normal ROM, no deformity  SKIN:  warm and dry, no rash  NEURO: normal speech. no facial droop. moves all extremities.  PSYCH:  appropriate mood and behavior      MDM and ED  Course     ED Medication Orders (From admission, onward)      Start Ordered     Status Ordering Provider    11/09/21 1858 11/09/21 1857  lidocaine (LIDODERM) 5 % 1 patch  Every 24 hours        Route: Transdermal  Ordered Dose: 1 patch     Last MAR action: Patch Applied Ronel Rodeheaver S              Labs Reviewed   URINALYSIS REFLEX TO MICROSCOPIC EXAM - REFLEX TO CULTURE - Abnormal; Notable for the following components:       Result Value    Color, UA Amber (*)     Clarity, UA Cloudy (*)     Protein, UR 100 (*)     Ketones UA 5 (*)     Bilirubin, UA Large (*)     WBC, UA 6 - 10 (*)     Hyaline Casts, UA 11 - 25 (*)     All other components within normal limits   URINALYSIS REFLEX TO MICROSCOPIC EXAM - REFLEX TO CULTURE       No orders to display           MDM  Number of Diagnoses or Management Options  Chronic bilateral low back pain without sciatica  Tinea cruris  Diagnosis management comments: Prior records were reviewed.  The patient has had multiple prior ED's visits for similar complaints.  She has had extensive work-up including CT of her spine, CT head, MRI brain, CT abdomen pelvis for multiple various complaints.  All of these work-ups have been negative as far as I can tell on review of records.  Given extensive recent work-up and the patient's complaints appear to be chronic in nature (ongoing for months), I do not feel that the  patient needs repeat labs or extensive work-up again in the ER.  In terms of her rash is most consistent with yeast infection. will treat with clotrimazole for rash and Lidoderm and NSAIDs for chronic pain.  Recommended follow-up with a primary care doctor.  Return precautions were given.    Patient Progress  Patient progress: stable      Recommended follow up with primary care doctor. Return precautions given. All questions answered.                 Procedures    Clinical Impression & Disposition     Clinical Impression  Final diagnoses:   Tinea cruris   Chronic bilateral low back  pain without sciatica        ED Disposition       ED Disposition   Discharge    Condition   --    Date/Time   Fri Nov 09, 2021  8:55 PM    Comment   Frederik Schmidt [place] discharge to home/self care.    Condition at disposition: Stable                  Discharge Medication List as of 11/09/2021  8:55 PM        START taking these medications    Details   clotrimazole (LOTRIMIN) 1 % cream Apply topically 2 (two) times daily, Starting Fri 11/09/2021, E-Rx      lidocaine (LIDODERM) 5 % Place 1 patch onto the skin every 24 hours Remove & Discard patch within 12 hours or as directed by MD, Starting Fri 11/09/2021, E-Rx      naproxen (NAPROSYN) 250 MG tablet Take 1.5 tablets (375 mg) by mouth 2 (two) times daily with meals, Starting Fri 11/09/2021, E-Rx                     Army Fossa, DO  11/09/21 2337

## 2021-11-09 NOTE — Discharge Instructions (Signed)
Take medication as as prescribed. Follow up with your primary care doctor. Return to the ED for continued or worsening symptoms.

## 2021-11-25 ENCOUNTER — Emergency Department
Admission: EM | Admit: 2021-11-25 | Discharge: 2021-11-25 | Disposition: A | Discharge status: Home / Self Care | Payer: Self-pay | Attending: Student in an Organized Health Care Education/Training Program | Admitting: Student in an Organized Health Care Education/Training Program

## 2021-11-25 DIAGNOSIS — J029 Acute pharyngitis, unspecified: Secondary | ICD-10-CM | POA: Insufficient documentation

## 2021-11-25 DIAGNOSIS — Z5901 Sheltered homelessness: Secondary | ICD-10-CM | POA: Insufficient documentation

## 2021-11-25 DIAGNOSIS — K047 Periapical abscess without sinus: Secondary | ICD-10-CM | POA: Insufficient documentation

## 2021-11-25 DIAGNOSIS — N898 Other specified noninflammatory disorders of vagina: Secondary | ICD-10-CM | POA: Insufficient documentation

## 2021-11-25 LAB — POCT RAPID STREP A: Rapid Strep A Screen POCT: NEGATIVE

## 2021-11-25 LAB — URINALYSIS REFLEX TO MICROSCOPIC EXAM - REFLEX TO CULTURE
Bilirubin, UA: NEGATIVE
Blood, UA: NEGATIVE
Glucose, UA: NEGATIVE
Ketones UA: NEGATIVE
Leukocyte Esterase, UA: NEGATIVE
Nitrite, UA: NEGATIVE
Protein, UR: NEGATIVE
Specific Gravity UA: >=1.03 (ref 1.001–1.035)
Urine pH: 6 (ref 5.0–8.0)
Urobilinogen, UA: 0.2 mg/dL (ref 0.2–2.0)

## 2021-11-25 LAB — COVID-19 (SARS-COV-2) & INFLUENZA  A/B, NAA (ROCHE LIAT)
Influenza A: NOT DETECTED
Influenza B: NOT DETECTED
SARS CoV 2 Overall Result: NOT DETECTED

## 2021-11-25 MED ORDER — LIDOCAINE 5 % EX PTCH
2.0000 | MEDICATED_PATCH | Freq: Once | CUTANEOUS | Status: DC
Start: 2021-11-25 — End: 2021-11-25
  Administered 2021-11-25: 2 via TRANSDERMAL
  Filled 2021-11-25: qty 2

## 2021-11-25 MED ORDER — ACETAMINOPHEN 325 MG PO TABS
650.0000 mg | ORAL_TABLET | Freq: Once | ORAL | Status: AC
Start: 2021-11-25 — End: 2021-11-25
  Administered 2021-11-25: 650 mg via ORAL
  Filled 2021-11-25: qty 2

## 2021-11-25 MED ORDER — LIDOCAINE VISCOUS HCL 2 % MT SOLN
10.0000 mL | Freq: Once | OROMUCOSAL | Status: AC
Start: 2021-11-25 — End: 2021-11-25
  Administered 2021-11-25: 10 mL via OROMUCOSAL
  Filled 2021-11-25: qty 15

## 2021-11-25 MED ORDER — ONDANSETRON 4 MG PO TBDP
4.0000 mg | ORAL_TABLET | Freq: Four times a day (QID) | ORAL | 0 refills | Status: AC | PRN
Start: 2021-11-25 — End: ?

## 2021-11-25 MED ORDER — ONDANSETRON 4 MG PO TBDP
4.0000 mg | ORAL_TABLET | Freq: Once | ORAL | Status: AC
Start: 2021-11-25 — End: 2021-11-25
  Administered 2021-11-25: 4 mg via ORAL
  Filled 2021-11-25: qty 1

## 2021-11-25 MED ORDER — VH MAGIC MOUTHWASH ORAL SUSP (W/O NYSTATIN)
15.0000 mL | Freq: Four times a day (QID) | ORAL | 0 refills | Status: AC
Start: 2021-11-25 — End: ?

## 2021-11-25 MED ORDER — KETOROLAC TROMETHAMINE 15 MG/ML IJ SOLN
15.0000 mg | Freq: Once | INTRAMUSCULAR | Status: AC
Start: 2021-11-25 — End: 2021-11-25
  Administered 2021-11-25: 15 mg via INTRAMUSCULAR
  Filled 2021-11-25: qty 1

## 2021-11-25 MED ORDER — NYSTATIN 100000 UNIT/GM EX CREA
TOPICAL_CREAM | Freq: Two times a day (BID) | CUTANEOUS | 0 refills | Status: AC
Start: 2021-11-25 — End: 2021-12-02

## 2021-11-25 MED ORDER — LIDOCAINE 5 % EX PTCH
1.0000 | MEDICATED_PATCH | CUTANEOUS | 0 refills | Status: AC
Start: 2021-11-25 — End: ?

## 2021-11-25 NOTE — Discharge Instructions (Signed)
You have been seen today for sore throat, dental infection, vaginal irritation.  We were able to rule out a life-threatening or dangerous cause for your current symptoms.  Please take the prescribed meds and see your dentist tomorrow    Please return to the Emergency Department (ED) as soon as possible if you develop any new or concerning symptoms which include but is not limited to chest pain, shortness of breath, dizziness, nausea, vomiting, significant or persistent abdominal pain. If you are unable to drive or walk please call 911 to be safely transported to your nearest medical facility.    Please call your primary care physician in 1-2 days for further evaluation as well as any specialist provided by calling the numbers listed below.     Please bring this paper to all doctor follow up visits as it may contain information that your doctor may request.    Please have your primary care physician follow up on all of your lab tests and imaging studies performed at this hospital visit. If your primary care physician can't do this, please call the hospital and ask for the medical records department to obtain personal records of this visit.

## 2021-11-25 NOTE — EDIE (Signed)
COLLECTIVE?NOTIFICATION?11/25/2021 11:20?[name], [name]?MRN: [id number]    Criteria Met      5 ED Visits in 12 Months    Security and Safety  No Security Events were found.  ED Care Guidelines  There are currently no ED Care Guidelines for this patient. Please check your facility's medical records system.    Flags      Negative COVID-19 Lab Result - [facility] - A specimen collected from this patient was negative for COVID-19 / Attributed By: [place] [employer] [facility] / Attributed On: 11/01/2021       Prescription Monitoring Program  000??- Narcotic Use Score  000??- Sedative Use Score  000??- Stimulant Use Score  000??- Overdose Risk Score  - All Scores range from 000-999 with 75% of the population scoring < 200 and on 1% scoring above 650  - The last digit of the narcotic, sedative, and stimulant score indicates the number of active prescriptions of that type  - Higher Use scores correlate with increased prescribers, pharmacies, mg equiv, and overlapping prescriptions  - Higher Overdose Risk Scores correlate with increased risk of unintentional overdose death   Concerning or unexpectedly high scores should prompt a review of the PMP record; this does not constitute checking PMP for prescribing purposes.    E.D. Visit Count (12 mo.)  Facility Visits   [place] - [facility] 2   [place] [facility] 4   Total 6   Note: Visits indicate total known visits.     Recent Emergency Department Visit Summary  Date Facility [facility] Type Diagnoses or Chief Complaint    Nov 25, 2021  [place] [name]  [place]  Emergency      body pain      Nov 09, 2021  [place] [facility]  [name]  [place]  Emergency      Massive body pain      Rash      Back Pain      Tinea cruris      Low back pain, unspecified      Other chronic pain      Nov 04, 2021  [place] [facility]  [name]  [place]  Emergency      Refill on Meds/Back Pain      Dizziness      Hemoptysis      Fall      Back Pain      Other chronic pain      Encounter  for issue of repeat prescription      Low back pain, unspecified      Nov 01, 2021  [place] [facility]  [name]  [place]  Emergency      general weakness      Fatigue      Other abnormalities of gait and mobility      Oct 30, 2021  [place] [name]  [place]  Emergency      medical eval      Generalized Body Aches      History of falling      Unspecified injury of head, initial encounter      Oct 29, 2021  [place] [facility]  [name]  [place]  Emergency      Stress      Social Issues      Low back pain, unspecified      Encounter for screening, unspecified      Other chronic pain  Housing instability, housed, homelessness in past 12 months        Recent Inpatient Visit Summary  No Recent Inpatient Visits were found.  Care Team  No Care Team was found.  Collective Portal  This patient has registered at the [facility] Emergency Department   For more information visit: [email or web address]     PLEASE NOTE:     1.   Any care recommendations and other clinical information are provided as guidelines or for historical purposes only, and providers should exercise their own clinical judgment when providing care.    2.   You may only use this information for purposes of treatment, payment or health care operations activities, and subject to the limitations of applicable Collective Policies.    3.   You should consult directly with the organization that provided a care guideline or other clinical history with any questions about additional information or accuracy or completeness of information provided.    ? 2022 [place], [employer] - [email or web address]

## 2021-11-25 NOTE — ED Triage Notes (Signed)
Pt with c/o dental pain, emesis, choking from emesis that began last night. Pt states weakness, cough. Pt states pain is 6-7/10 and unable to describe. Pt states taking Amoxicillin prior to arrival.

## 2021-11-26 ENCOUNTER — Encounter (INDEPENDENT_AMBULATORY_CARE_PROVIDER_SITE_OTHER): Payer: Self-pay

## 2021-11-26 NOTE — Progress Notes (Signed)
[facility] for [employer] [facility]):     Referral received to schedule an appointment with the [facility] for [employer], however patient is unable to be contacted for scheduling and their voicemail is full. Will try to contact patient at a later time.    Dawayne Patricia  Patient Access Associate II  [facility] for [employer]   [phone]

## 2021-11-28 ENCOUNTER — Emergency Department
Admission: EM | Admit: 2021-11-28 | Discharge: 2021-11-28 | Disposition: A | Discharge status: Home / Self Care | Payer: Self-pay | Attending: Student in an Organized Health Care Education/Training Program | Admitting: Student in an Organized Health Care Education/Training Program

## 2021-11-28 DIAGNOSIS — B349 Viral infection, unspecified: Secondary | ICD-10-CM | POA: Insufficient documentation

## 2021-11-28 DIAGNOSIS — R079 Chest pain, unspecified: Secondary | ICD-10-CM

## 2021-11-28 DIAGNOSIS — Z20822 Contact with and (suspected) exposure to covid-19: Secondary | ICD-10-CM | POA: Insufficient documentation

## 2021-11-28 LAB — URINALYSIS, REFLEX TO MICROSCOPIC EXAM IF INDICATED
Bilirubin, UA: NEGATIVE
Blood, UA: NEGATIVE
Glucose, UA: NEGATIVE
Ketones UA: NEGATIVE
Leukocyte Esterase, UA: NEGATIVE
Nitrite, UA: NEGATIVE
Protein, UR: 30 — AB
Specific Gravity UA: 1.012 (ref 1.001–1.035)
Urine pH: 6 (ref 5.0–8.0)
Urobilinogen, UA: NEGATIVE mg/dL (ref 0.2–2.0)

## 2021-11-28 LAB — CBC AND DIFFERENTIAL
Absolute NRBC: 0 10*3/uL (ref 0.00–0.00)
Basophils Absolute Automated: 0.04 10*3/uL (ref 0.00–0.08)
Basophils Automated: 0.5 %
Eosinophils Absolute Automated: 0.16 10*3/uL (ref 0.00–0.44)
Eosinophils Automated: 2.1 %
Hematocrit: 42.9 % (ref 34.7–43.7)
Hgb: 13.7 g/dL (ref 11.4–14.8)
Immature Granulocytes Absolute: 0.02 10*3/uL (ref 0.00–0.07)
Immature Granulocytes: 0.3 %
Lymphocytes Absolute Automated: 1.43 10*3/uL (ref 0.42–3.22)
Lymphocytes Automated: 18.7 %
MCH: 31.4 pg (ref 25.1–33.5)
MCHC: 31.9 g/dL (ref 31.5–35.8)
MCV: 98.4 fL — ABNORMAL HIGH (ref 78.0–96.0)
MPV: 9.9 fL (ref 8.9–12.5)
Monocytes Absolute Automated: 1.12 10*3/uL — ABNORMAL HIGH (ref 0.21–0.85)
Monocytes: 14.7 %
Neutrophils Absolute: 4.87 10*3/uL (ref 1.10–6.33)
Neutrophils: 63.7 %
Nucleated RBC: 0 /100 WBC (ref 0.0–0.0)
Platelets: 144 10*3/uL (ref 142–346)
RBC: 4.36 10*6/uL (ref 3.90–5.10)
RDW: 14 % (ref 11–15)
WBC: 7.64 10*3/uL (ref 3.10–9.50)

## 2021-11-28 LAB — COMPREHENSIVE METABOLIC PANEL
ALT: 19 U/L (ref 0–55)
AST (SGOT): 19 U/L (ref 5–41)
Albumin/Globulin Ratio: 1.2 (ref 0.9–2.2)
Albumin: 3.6 g/dL (ref 3.5–5.0)
Alkaline Phosphatase: 110 U/L (ref 37–117)
Anion Gap: 10 (ref 5.0–15.0)
BUN: 15 mg/dL (ref 7.0–21.0)
Bilirubin, Total: 0.5 mg/dL (ref 0.2–1.2)
CO2: 24 mEq/L (ref 17–29)
Calcium: 8.7 mg/dL (ref 8.5–10.5)
Chloride: 106 mEq/L (ref 99–111)
Creatinine: 0.8 mg/dL (ref 0.4–1.0)
Globulin: 3 g/dL (ref 2.0–3.6)
Glucose: 185 mg/dL — ABNORMAL HIGH (ref 70–100)
Potassium: 4.3 mEq/L (ref 3.5–5.3)
Protein, Total: 6.6 g/dL (ref 6.0–8.3)
Sodium: 140 mEq/L (ref 135–145)

## 2021-11-28 LAB — ECG 12-LEAD
Atrial Rate: 80 {beats}/min
IHS MUSE NARRATIVE AND IMPRESSION: NORMAL
P Axis: 27 degrees
P-R Interval: 124 ms
Q-T Interval: 358 ms
QRS Duration: 70 ms
QTC Calculation (Bezet): 412 ms
R Axis: 25 degrees
T Axis: 54 degrees
Ventricular Rate: 80 {beats}/min

## 2021-11-28 LAB — LIPASE: Lipase: 8 U/L (ref 8–78)

## 2021-11-28 LAB — COVID-19 (SARS-COV-2) & INFLUENZA  A/B, NAA (ROCHE LIAT)
Influenza A: NOT DETECTED
Influenza B: NOT DETECTED
SARS CoV 2 Overall Result: NOT DETECTED

## 2021-11-28 LAB — PT AND APTT
PT INR: 1.1 (ref 0.9–1.1)
PT: 13 s — ABNORMAL HIGH (ref 10.1–12.9)
PTT: 38 s (ref 27–39)

## 2021-11-28 LAB — GFR: EGFR: >60

## 2021-11-28 MED ORDER — KETOROLAC TROMETHAMINE 15 MG/ML IJ SOLN
15.0000 mg | Freq: Once | INTRAMUSCULAR | Status: AC
Start: 2021-11-28 — End: 2021-11-28
  Administered 2021-11-28: 08:00:00 15 mg via INTRAVENOUS
  Filled 2021-11-28: qty 1

## 2021-11-28 MED ORDER — ACETAMINOPHEN 500 MG PO TABS
1000.0000 mg | ORAL_TABLET | Freq: Once | ORAL | Status: AC
Start: 2021-11-28 — End: 2021-11-28
  Administered 2021-11-28: 08:00:00 1000 mg via ORAL
  Filled 2021-11-28: qty 2

## 2021-11-28 MED ORDER — SODIUM CHLORIDE 0.9 % IV BOLUS
1000.0000 mL | Freq: Once | INTRAVENOUS | Status: AC
Start: 2021-11-28 — End: 2021-11-28
  Administered 2021-11-28: 08:00:00 1000 mL via INTRAVENOUS

## 2021-11-28 MED ORDER — FLUCONAZOLE 100 MG PO TABS
150.0000 mg | ORAL_TABLET | Freq: Once | ORAL | Status: AC
Start: 2021-11-28 — End: 2021-11-28
  Administered 2021-11-28: 10:00:00 150 mg via ORAL
  Filled 2021-11-28: qty 2

## 2021-11-28 NOTE — ED Notes (Signed)
Pt coming in for multiple complaints--pt states that she has a dental and yeast infection. Pt also states that she doesn't think that she's catching a cold, but she's had three episodes of bleeding. Pt was given amoxicillin to take. Pt also c/o lower abd pain. Going from feeling hot to having cold sweats. Pt was here a couple days ago and was tested for COVID/flu and it was negative. Pt states that "she's choking a lot that she's in tears."

## 2021-11-28 NOTE — ED Triage Notes (Signed)
[name] is a 60 y.o. female who presents to ED with multiple complaints. Reports "bleeding from my nose and throat" yesterday, reports difficulty breathing, cough noted during triage. Reports being treated for a dental infection with amoxicillin, reports GI upset as a result. Speaking in full sentences at time of triage.     BP 135/70    Pulse 90    Temp 98.8 F (37.1 C) (Oral)    Resp 20    Ht 5\' 4"  (1.626 m)    Wt 107.5 kg    SpO2 95%    BMI 40.66 kg/m

## 2021-11-28 NOTE — EDIE (Signed)
COLLECTIVE?NOTIFICATION?11/28/2021 07:11?ROSETTA, RUPNOW L?MRN: [id number]    Criteria Met      5 ED Visits in 12 Months    Security and Safety  No Security Events were found.  ED Care Guidelines  There are currently no ED Care Guidelines for this patient. Please check your facility's medical records system.    Flags      Negative COVID-19 Lab Result - [facility] - A specimen collected from this patient was negative for COVID-19 / Attributed By: [place] Department [facility] / Attributed On: 11/01/2021       Prescription Monitoring Program  000??- Narcotic Use Score  000??- Sedative Use Score  000??- Stimulant Use Score  000??- Overdose Risk Score  - All Scores range from 000-999 with 75% of the population scoring < 200 and on 1% scoring above 650  - The last digit of the narcotic, sedative, and stimulant score indicates the number of active prescriptions of that type  - Higher Use scores correlate with increased prescribers, pharmacies, mg equiv, and overlapping prescriptions  - Higher Overdose Risk Scores correlate with increased risk of unintentional overdose death   Concerning or unexpectedly high scores should prompt a review of the PMP record; this does not constitute checking PMP for prescribing purposes.    E.D. Visit Count (12 mo.)  Facility Visits   [place] - [facility] 2   [place] [facility] [facility] 5   Total 7   Note: Visits indicate total known visits.     Recent Emergency Department Visit Summary  Date Facility [facility] Type Diagnoses or Chief Complaint    Nov 28, 2021  [place] [facility]  [name]  [place]  Emergency      infections, vomiting blood, flu symptoms      Nov 25, 2021  [place] [facility]  [name]  [place]  Emergency      body pain      Emesis      Dental Pain      Acute pharyngitis, unspecified      Other specified noninflammatory disorders of vagina      Periapical abscess without sinus      Nov 09, 2021  [place] - [facility]  [name]  [place]  Emergency      Massive body pain      Rash       Back Pain      Tinea cruris      Low back pain, unspecified      Other chronic pain      Nov 04, 2021  [place] [facility]  [name]  [place]  Emergency      Refill on Meds/Back Pain      Dizziness      Hemoptysis      Fall      Back Pain      Other chronic pain      Encounter for issue of repeat prescription      Low back pain, unspecified      Nov 01, 2021  [place] [facility]  [name]  [place]  Emergency      general weakness      Fatigue      Other abnormalities of gait and mobility      Oct 30, 2021  [place] [facility]  [name]  [place]  Emergency      medical eval      Generalized Body Aches      History of falling  Unspecified injury of head, initial encounter      Oct 29, 2021  [place] [facility]  [name]  [place]  Emergency      Stress      Social Issues      Low back pain, unspecified      Encounter for screening, unspecified      Other chronic pain      Housing instability, housed, homelessness in past 12 months        Recent Inpatient Visit Summary  No Recent Inpatient Visits were found.  Care Team  No Care Team was found.  Collective Portal  This patient has registered at the [facility] Emergency Department   For more information visit: [email or web address]     PLEASE NOTE:     1.   Any care recommendations and other clinical information are provided as guidelines or for historical purposes only, and providers should exercise their own clinical judgment when providing care.    2.   You may only use this information for purposes of treatment, payment or health care operations activities, and subject to the limitations of applicable Collective Policies.    3.   You should consult directly with the organization that provided a care guideline or other clinical history with any questions about additional information or accuracy or completeness of information provided.    ? 2022 [place], [employer]. -  [email or web address]

## 2021-11-28 NOTE — ED Provider Notes (Signed)
EMERGENCY DEPARTMENT HISTORY AND PHYSICAL EXAM     None        Date: 11/25/2021  Patient Name: [name]    History of Presenting Illness     Chief Complaint   Patient presents with    Emesis    Dental Pain         Additional History: [name] is a 60 y.o. female presenting to the ED with NBNB emesis and dental pain  Patient presented today for dental pain and nausea.  She reports that symptoms started last week, she has had dental caries for a long time and has multiple loose teeth in her lower jaw that need to be pulled according to her dentist.  4 days ago she saw her dentist and he started her on amoxicillin for her dental pain.  Dental pain has been very painful, she has been taking multiple doses of 500 mg Tylenol and ibuprofen throughout daytime.  In the last 3 days she has started to develop a severe sore throat associated with a dry cough that makes it difficult to breathe.  Associated with chest tightness when she coughs.  No chest tightness at rest.  Associated with subjective fevers and chills.  Currently lives in a shelter so unable to measure her temperature.  Patient reports that she has no muffled voice, no drooling, neck stiffness.    Separately reports vulvar itching and irritation for unclear amount of time but for at least multiple days.  No dysuria, hematuria, vaginal bleeding, lesions, discharge.  She reports not being sexually active for years.    All associated symptoms and pertinent negative seen in review of systems below.    PCP: Pcp, None, MD  SPECIALISTS:    No current facility-administered medications for this encounter.     Current Outpatient Medications   Medication Sig Dispense Refill    acetaminophen (TYLENOL) 500 MG tablet Take 1 tablet (500 mg) by mouth every 4 (four) hours as needed for Pain 30 tablet 0    clotrimazole (LOTRIMIN) 1 % cream Apply topically 2 (two) times daily 28 g 0    dicyclomine (BENTYL) 10 MG capsule Take 1 capsule (10 mg) by mouth every 6  (six) hours as needed (abdominal pain) 15 capsule 0    ibuprofen (ADVIL) 600 MG tablet Take 1 tablet (600 mg) by mouth every 6 (six) hours as needed for Pain or Fever 90 tablet 0    lidocaine (LIDODERM) 5 % Place 1 patch onto the skin every 24 hours Remove & Discard patch within 12 hours or as directed by MD 30 patch 0    Magic Mouthwash (w/o nystatin) Take 15 mLs by mouth 4 (four) times daily 120 mL 0    naproxen (NAPROSYN) 250 MG tablet Take 1.5 tablets (375 mg) by mouth 2 (two) times daily with meals 14 tablet 0    nystatin (MYCOSTATIN) cream Apply topically 2 (two) times daily for 7 days Apply to affected area 3 to 4 times a day for 7 days 15 g 0    ondansetron (ZOFRAN-ODT) 4 MG disintegrating tablet Take 1 tablet (4 mg) by mouth every 6 (six) hours as needed for Nausea 8 tablet 0       Past History     I have reviewed Past Medical History, Surgical History, Family History and Social as documented below. Also reviewed current medication history per nursing notes.     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       Past Surgical History:  Past Surgical History:   Procedure Laterality Date    LEG SURGERY Left     Plate placed lower extremmity.       Family History:  History reviewed. No pertinent family history.    Social History:  Social History     Tobacco Use    Smoking status: Never    Smokeless tobacco: Never    Tobacco comments:     no use of cigarettes in the past 30 days    Vaping Use    Vaping Use: Never used   Substance Use Topics    Alcohol use: Yes     Comment: celebratory, No alcohol use in the past 12 months     Drug use: Never     Comment: no use of substance in the past 30 days        Allergies:  No Known Allergies    Review of Systems       Constitutional: Negative for fever or chills.   Neurological:  Negative for numbness, weakness.  HENT: Positive for sore throat, negative for neck pain.   Cardiovascular: Negative for chest pain, +tightness.  Respiratory: Negative for shortness of breath, +cough  Gastrointestinal: Negative for abdominal pain, +nausea  Genitourinary: Negative for dysuria, hematuria.  Musculoskeletal: Negative for gait changes, muscle pain.   Skin: Negative for wound.   Hematological: Negative for easy bruising    All other review of systems reviewed and are otherwise negative.     Physical Exam   BP 115/78    Pulse 68    Temp 97.5 F (36.4 C) (Skin)    Resp 17    SpO2 97%       Constitutional: Alert, well appearing and in no distress.   Head: Normocephalic and atraumatic.   Mouth/Throat: Oropharynx is moist.  Mild posterior erythema.  Multiple dental caries upper and lower jaw and multiple loose teeth anteriorly.  No discharge, gum bleeding, uvula midline without tonsillar swelling or exudate.  Speaks with clear phonation no muffled voice  Eyes: Conjunctivae normal. Pupils are equal and round.   Neck: Normal range of motion. Neck supple.   Cardiovascular: Normal rate, regular rhythm  Pulmonary/Chest: Effort normal and breath sounds normal.   Abdominal: Non tender. Soft. Non distended.   GU:  Mild vulvar erythema.  No lesions.  No discharge or bleeding.  (RN chaperone Sajina present)  Musculoskeletal: No peripheral edema, gross deformity    Neurological: Patient is alert and GCS score is 15.   Skin: Skin is warm and dry.  Psychiatric: Affect normal.     Diagnostic Study Results     Labs -     Results       Procedure Component Value Units Date/Time    Urinalysis Reflex to Microscopic Exam- Reflex to Culture [[id number]] Collected: 11/25/21 1336     Updated: 11/25/21 1415     Urine Type Urine, Clean Ca     Color, UA Yellow     Clarity, UA Clear     Specific Gravity UA >=1.030     Urine pH 6.0     Leukocyte Esterase, UA Negative     Nitrite, UA Negative     Protein, UR Negative     Glucose, UA  Negative     Ketones UA Negative     Urobilinogen, UA 0.2 mg/dL      Bilirubin, UA Negative     Blood, UA Negative     RBC, UA 3 - 5 /hpf      WBC, UA 0 - 5 /hpf      Squamous Epithelial Cells, Urine 0 - 5 /hpf      Urine Mucus Present    COVID-19 (SARS-CoV-2) and Influenza A/B, NAA ([facility] Rapid)- Age 44 and above [[id number]] Collected: 11/25/21 1154    Specimen: Culturette from Nasopharyngeal Updated: 11/25/21 1237     Purpose of COVID testing Diagnostic -PUI     SARS-CoV-2 Specimen Source Nasal Swab     SARS CoV 2 Overall Result Not Detected     Influenza A Not Detected     Influenza B Not Detected    Narrative:      o Collect and clearly label specimen type:  o PREFERRED-Upper respiratory specimen: One Nasal Swab in  Transport Media.  o Hand deliver to laboratory ASAP  Diagnostic -PUI    Rapid Group A Strep POC [[id number]] Collected: 11/25/21 1226    Specimen: Throat Updated: 11/25/21 1234     POCT QC Pass     Rapid Strep A Screen POCT Negative     Comment Negative Results should be confirmed by throat Cx to confirm absence of Strep A inf.            Radiologic Studies -   Radiology Results (24 Hour)       ** No results found for the last 24 hours. **        .    Medical Decision Making     I Dr. Artis Delay, am the primary provider of record for this patient.   I reviewed the vital signs, available nursing notes, past medical history, past surgical history, family history and social history.    Old Medical Records Reviewed if available:   Old records.  Nursing notes.  Old EKG.    NWG:NFAOZHY presenting for sore throat, dental pain, difficulty swallowing.  Likely due to combination of dental pain from caries and infection with associated pharyngitis.  After medication in ED patient passed PO challenge and drank liquids.  No evidence of Ludwigs angina at base of mouth.  No neck stiffness, muffled voice or evidence of RPA, PTA on history and physical exam.  Do not suspect deep space neck infection.  There is no evidence of  airway instability.  Discussed supportive care including fluid hydration, lozenges, OTC pain medication as needed.  Instructed to continue amoxicillin and patient has appointment to follow up with dentist for tooth extraction tomorrow.  Return precautions given for drooling, shortness of breath, inability to maintain secretions, swallow    Separately has reported vulvar irritation, no findings to suggest yeast infection but may be having early localized irritation or yeast infection d/t antibiotics.  Rx fungal cream and recommended probiotics.  Do not suspect STI.  No evidence UTI      Procedures:        EKG:  Interpreted by myself:    Rate:    Rhythm:    Intervals:    Interpretation:   Comparison:     ED Course:  Vital Signs-Reviewed the patient's vital signs.   No data found.  Pulse Oximetry Analysis - SpO2: 96 % on RA             Diagnosis  Clinical Impression:   1. Dental infection    2. Vaginal irritation    3. Pharyngitis, unspecified etiology        Treatment Plan:   ED Disposition       ED Disposition   Discharge    Condition   --    Date/Time   Sun Nov 25, 2021  3:41 PM    Comment   KHRISTA BRAUN discharge to home/self care.    Condition at disposition: Stable                   _______________________________      Attestations: This note is prepared by Norva Karvonen, MD.    This note was generated by the Epic EMR system/ Dragon speech recognition and may contain inherent errors or omissions not intended by the user. Grammatical errors, random word insertions, deletions and pronoun errors  are occasional consequences of this technology due to software limitations. Not all errors are caught or corrected. If there are questions or concerns about the content of this note or information contained within the body of this dictation they should be addressed directly with the author for clarification.    _______________________________     Norva Karvonen, MD  11/28/21 2106

## 2021-11-29 NOTE — ED Provider Notes (Signed)
EMERGENCY DEPARTMENT HISTORY AND PHYSICAL EXAM     None        Date: 11/28/2021  Patient Name: [name]    History of Presenting Illness     Chief Complaint   Patient presents with    Emesis    Generalized Body Aches         Additional History: [name] is a 60 y.o. female presenting to the ED with a chief complaint of generalized malaise and intermittent "cold sweats "x4 days.  Of note patient was recently seen in the emergency department on 12/18 with similar complaints, had an unremarkable work-up and after symptomatic treatment was discharged with topical nystatin cream for possible yeast infection.  Patient reports that she has not filled her prescriptions from ED.  She states that she feels like she is "coming down with a cold "and specifically requests for inpatient admission due to issues with having a roommate at her current shelter.  She denies SI/HI auditory visual hallucinations.  She reports that she simply prefers not to have a roommate and prefers the hospital setting.  She denies headache syncope nausea/vomiting or abdominal pain.      All associated symptoms and pertinent negative seen in review of systems below.    PCP: Pcp, None, MD  SPECIALISTS:    No current facility-administered medications for this encounter.     Current Outpatient Medications   Medication Sig Dispense Refill    acetaminophen (TYLENOL) 500 MG tablet Take 1 tablet (500 mg) by mouth every 4 (four) hours as needed for Pain 30 tablet 0    clotrimazole (LOTRIMIN) 1 % cream Apply topically 2 (two) times daily 28 g 0    dicyclomine (BENTYL) 10 MG capsule Take 1 capsule (10 mg) by mouth every 6 (six) hours as needed (abdominal pain) 15 capsule 0    ibuprofen (ADVIL) 600 MG tablet Take 1 tablet (600 mg) by mouth every 6 (six) hours as needed for Pain or Fever 90 tablet 0    lidocaine (LIDODERM) 5 % Place 1 patch onto the skin every 24 hours Remove & Discard patch within 12 hours or as directed by MD 30 patch 0     Magic Mouthwash (w/o nystatin) Take 15 mLs by mouth 4 (four) times daily 120 mL 0    naproxen (NAPROSYN) 250 MG tablet Take 1.5 tablets (375 mg) by mouth 2 (two) times daily with meals 14 tablet 0    nystatin (MYCOSTATIN) cream Apply topically 2 (two) times daily for 7 days Apply to affected area 3 to 4 times a day for 7 days 15 g 0    ondansetron (ZOFRAN-ODT) 4 MG disintegrating tablet Take 1 tablet (4 mg) by mouth every 6 (six) hours as needed for Nausea 8 tablet 0       Past History     I have reviewed Past Medical History, Surgical History, Family History and Social as documented below. Also reviewed current medication history per nursing notes.     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       Past Surgical History:  Past Surgical History:   Procedure Laterality Date    LEG SURGERY Left     Plate placed lower extremmity.  Family History:  History reviewed. No pertinent family history.    Social History:  Social History     Tobacco Use    Smoking status: Never    Smokeless tobacco: Never    Tobacco comments:     no use of cigarettes in the past 30 days    Vaping Use    Vaping Use: Never used   Substance Use Topics    Alcohol use: Yes     Comment: celebratory, No alcohol use in the past 12 months     Drug use: Never     Comment: no use of substance in the past 30 days        Allergies:  No Known Allergies    Review of Systems       Constitutional: Negative for fever or chills.   Neurological: Negative for speech changes or numbness.  Eyes: Negative for visual changes or eye pain.  HENT: Negative for sore throat, neck pain, or runny nose.   Cardiovascular: Negative for chest pain. No heart palps.  Respiratory: Negative for shortness of breath. No cough  Gastrointestinal: Negative for abdominal pain, nausea, vomiting, diarrhea, or  blood in stool.   Genitourinary: Negative for dysuria or hematuria.  Musculoskeletal: Negative for gait changes, joint pain or muscle pain.   Skin: Negative for itching or rash.   Hematological: Negative for easy bruising    All other review of systems reviewed and are otherwise negative.     Physical Exam   BP 118/74    Pulse 61    Temp 97.9 F (36.6 C) (Oral)    Resp 20    Ht 5\' 4"  (1.626 m)    Wt 107.5 kg    SpO2 95%    BMI 40.66 kg/m       Constitutional: Alert, well appearing and in no distress.   Head: Normocephalic and atraumatic.   Mouth/Throat: Oropharynx is clear and moist.   Eyes: Conjunctivae normal and EOM are normal. Pupils are equal and round.   Neck: Normal range of motion. Trachea midline.   Cardiovascular: Normal rate, regular rhythm  Pulmonary/Chest: Effort normal and lungs clear to auscultation bilaterally.   Abdominal: Soft. Non distended. Non tender. No rebound or guarding.  Pelvic deferred.  Musculoskeletal: No peripheral edema. No tenderness.    Neurological: Patient is alert and oriented to person, place, and time. No cranial nerve deficits.  GCS score is 15. Sensation intact. Strength 5/5 in all extremities. Romberg negative. Pronator negative. Gait normal.   Skin: Skin is warm and dry. No rash  Psychiatric: Affect normal.     Diagnostic Study Results     Labs -     Results       Procedure Component Value Units Date/Time    Urinalysis Reflex to Microscopic Exam [[id number]]  (Abnormal) Collected: 11/28/21 1013    Specimen: Urine Updated: 11/28/21 1047     Urine Type Clean Catch     Color, UA Yellow     Clarity, UA Clear     Specific Gravity UA 1.012     Urine pH 6.0     Leukocyte Esterase, UA Negative     Nitrite, UA Negative     Protein, UR 30     Glucose, UA Negative     Ketones UA Negative     Urobilinogen, UA Negative mg/dL      Bilirubin, UA Negative     Blood, UA Negative     RBC, UA 0 - 2 /  hpf      WBC, UA 0 - 5 /hpf      Squamous Epithelial Cells, Urine 0 - 5 /hpf      Urine Mucus  Present    Comprehensive metabolic panel [[id number]]  (Abnormal) Collected: 11/28/21 0755    Specimen: Blood Updated: 11/28/21 0836     Glucose 185 mg/dL      BUN 40.9 mg/dL      Creatinine 0.8 mg/dL      Sodium 811 mEq/L      Potassium 4.3 mEq/L      Chloride 106 mEq/L      CO2 24 mEq/L      Calcium 8.7 mg/dL      Protein, Total 6.6 g/dL      Albumin 3.6 g/dL      AST (SGOT) 19 U/L      ALT 19 U/L      Alkaline Phosphatase 110 U/L      Bilirubin, Total 0.5 mg/dL      Globulin 3.0 g/dL      Albumin/Globulin Ratio 1.2     Anion Gap 10.0    Lipase [[id number]] Collected: 11/28/21 0755    Specimen: Blood Updated: 11/28/21 0836     Lipase 8 U/L     GFR [[id number]] Collected: 11/28/21 0755     Updated: 11/28/21 0836     EGFR >60.0       COVID-19 (SARS-CoV-2) and Influenza A/B, NAA ([facility] Rapid)- Admission [[id number]] Collected: 11/28/21 0755    Specimen: Culturette from Nasopharyngeal Updated: 11/28/21 0829     Purpose of COVID testing Diagnostic -[facility]     SARS-CoV-2 Specimen Source Nasal Swab     SARS CoV 2 Overall Result Not Detected     Influenza A Not Detected     Influenza B Not Detected    Narrative:      o Collect and clearly label specimen type:  o PREFERRED-Upper respiratory specimen: One Nasal Swab in  Transport Media.  o Hand deliver to laboratory ASAP  Diagnostic -[facility]    PT/APTT [[id number]]  (Abnormal) Collected: 11/28/21 0755     Updated: 11/28/21 0818     PT 13.0 sec      PT INR 1.1     PTT 38 sec     CBC and differential [[id number]]  (Abnormal) Collected: 11/28/21 0755    Specimen: Blood Updated: 11/28/21 0813     WBC 7.64 x10 3/uL      Hgb 13.7 g/dL      Hematocrit 03.4 %      Platelets 144 x10 3/uL      RBC 4.36 x10 6/uL      MCV 98.4 fL      MCH 31.4 pg      MCHC 31.9 g/dL      RDW 14 %      MPV 9.9 fL      Neutrophils 63.7 %      Lymphocytes Automated 18.7 %      Monocytes 14.7 %      Eosinophils Automated 2.1 %      Basophils Automated 0.5 %      Immature Granulocytes 0.3 %      Nucleated RBC 0.0 /100  WBC      Neutrophils Absolute 4.87 x10 3/uL      Lymphocytes Absolute Automated 1.43 x10 3/uL      Monocytes Absolute Automated 1.12 x10 3/uL      Eosinophils Absolute Automated 0.16 x10 3/uL  Basophils Absolute Automated 0.04 x10 3/uL      Immature Granulocytes Absolute 0.02 x10 3/uL      Absolute NRBC 0.00 x10 3/uL             Radiologic Studies -   Radiology Results (24 Hour)       ** No results found for the last 24 hours. **        .    Medical Decision Making     I Dr. Remo Lipps, am the primary provider of record for this patient.   I reviewed the vital signs, available nursing notes, past medical history, past surgical history, family history and social history.    Old Medical Records Reviewed:   Old records.  Nursing notes.  Old EKG.    MDM: Differential diagnosis includes viral syndrome versus COVID-19 versus medical screening examination.  Patient presents with nonspecific symptoms of malaise and URI symptoms with no concerning historical features and normal vital signs today.  We will plan for basic labs, repeat urinalysis and COVID/flu swab, symptomatic therapy, dispo likely discharge if work-up unremarkable.  Although there is limitation in properly diagnosing vaginal candidiasis due to patient deferring pelvic examination today, given the patient's reported symptoms with recent antibiotic use it would not be unreasonable to empirically treat with 1 dose of oral Diflucan today.  Patient agrees to this plan. Otherwise there is no indication for inpatient hospitalization at this time.  I provided the patient with resources for local shelters and discussed return precautions which she understood clearly.        EKG:  Interpreted by myself:    Rate: 80   Rhythm: Normal SInus   Intervals: Normal   Interpretation: No significant ST elevations, ST depressions, or ischemic ST segment changes.    Comparison: No significant change in comparison to prior ECG dated 04 Nov 2021       ED Course:  Vital  Signs-Reviewed the patient's vital signs.   No data found.  Pulse Oximetry Analysis - SpO2: 95 % on RA           Diagnosis     Clinical Impression:   1. Viral infection        Treatment Plan:   ED Disposition       ED Disposition   Discharge    Condition   --    Date/Time   Wed Nov 28, 2021 10:49 AM    Comment   Frederik Schmidt [place] discharge to home/self care.    Condition at disposition: Stable                   _______________________________      Attestations: This note is prepared by Owens Shark, MD.    This note was generated by the Epic EMR system/ Dragon speech recognition and may contain inherent errors or omissions not intended by the user. Grammatical errors, random word insertions, deletions and pronoun errors  are occasional consequences of this technology due to software limitations. Not all errors are caught or corrected. If there are questions or concerns about the content of this note or information contained within the body of this dictation they should be addressed directly with the author for clarification.    _______________________________       Owens Shark, MD  11/29/21 [phone]

## 2021-12-03 ENCOUNTER — Emergency Department: Payer: Self-pay

## 2021-12-03 ENCOUNTER — Emergency Department
Admission: EM | Admit: 2021-12-03 | Discharge: 2021-12-03 | Disposition: A | Discharge status: Home / Self Care | Payer: Self-pay | Attending: Emergency Medicine | Admitting: Emergency Medicine

## 2021-12-03 DIAGNOSIS — R42 Dizziness and giddiness: Secondary | ICD-10-CM | POA: Insufficient documentation

## 2021-12-03 DIAGNOSIS — Z20822 Contact with and (suspected) exposure to covid-19: Secondary | ICD-10-CM | POA: Insufficient documentation

## 2021-12-03 DIAGNOSIS — R739 Hyperglycemia, unspecified: Secondary | ICD-10-CM | POA: Insufficient documentation

## 2021-12-03 DIAGNOSIS — J069 Acute upper respiratory infection, unspecified: Secondary | ICD-10-CM | POA: Insufficient documentation

## 2021-12-03 LAB — URINALYSIS REFLEX TO MICROSCOPIC EXAM - REFLEX TO CULTURE
Bilirubin, UA: NEGATIVE
Blood, UA: NEGATIVE
Glucose, UA: NEGATIVE
Ketones UA: NEGATIVE
Leukocyte Esterase, UA: NEGATIVE
Nitrite, UA: NEGATIVE
Protein, UR: NEGATIVE
Specific Gravity UA: >=1.03 (ref 1.001–1.035)
Urine pH: 5.5 (ref 5.0–8.0)
Urobilinogen, UA: 0.2 mg/dL (ref 0.2–2.0)

## 2021-12-03 LAB — CBC AND DIFFERENTIAL
Absolute NRBC: 0 10*3/uL (ref 0.00–0.00)
Basophils Absolute Automated: 0.05 10*3/uL (ref 0.00–0.08)
Basophils Automated: 0.5 %
Eosinophils Absolute Automated: 0.23 10*3/uL (ref 0.00–0.44)
Eosinophils Automated: 2.5 %
Hematocrit: 41.5 % (ref 34.7–43.7)
Hgb: 13.6 g/dL (ref 11.4–14.8)
Immature Granulocytes Absolute: 0.03 10*3/uL (ref 0.00–0.07)
Immature Granulocytes: 0.3 %
Lymphocytes Absolute Automated: 3.45 10*3/uL — ABNORMAL HIGH (ref 0.42–3.22)
Lymphocytes Automated: 37.3 %
MCH: 31.7 pg (ref 25.1–33.5)
MCHC: 32.8 g/dL (ref 31.5–35.8)
MCV: 96.7 fL — ABNORMAL HIGH (ref 78.0–96.0)
MPV: 9.9 fL (ref 8.9–12.5)
Monocytes Absolute Automated: 0.6 10*3/uL (ref 0.21–0.85)
Monocytes: 6.5 %
Neutrophils Absolute: 4.9 10*3/uL (ref 1.10–6.33)
Neutrophils: 52.9 %
Nucleated RBC: 0 /100 WBC (ref 0.0–0.0)
Platelets: 200 10*3/uL (ref 142–346)
RBC: 4.29 10*6/uL (ref 3.90–5.10)
RDW: 13 % (ref 11–15)
WBC: 9.26 10*3/uL (ref 3.10–9.50)

## 2021-12-03 LAB — COMPREHENSIVE METABOLIC PANEL
ALT: 12 U/L (ref 0–55)
AST (SGOT): 16 U/L (ref 5–41)
Albumin/Globulin Ratio: 1.2 (ref 0.9–2.2)
Albumin: 3.6 g/dL (ref 3.5–5.0)
Alkaline Phosphatase: 108 U/L (ref 37–117)
Anion Gap: 11 (ref 5.0–15.0)
BUN: 20 mg/dL (ref 7.0–21.0)
Bilirubin, Total: 0.2 mg/dL (ref 0.2–1.2)
CO2: 25 mEq/L (ref 17–29)
Calcium: 8.9 mg/dL (ref 8.5–10.5)
Chloride: 106 mEq/L (ref 99–111)
Creatinine: 0.8 mg/dL (ref 0.4–1.0)
Globulin: 2.9 g/dL (ref 2.0–3.6)
Glucose: 231 mg/dL — ABNORMAL HIGH (ref 70–100)
Potassium: 4.3 mEq/L (ref 3.5–5.3)
Protein, Total: 6.5 g/dL (ref 6.0–8.3)
Sodium: 142 mEq/L (ref 135–145)

## 2021-12-03 LAB — COVID-19 (SARS-COV-2) & INFLUENZA  A/B, NAA (ROCHE LIAT)
Influenza A: NOT DETECTED
Influenza B: NOT DETECTED
SARS CoV 2 Overall Result: NOT DETECTED

## 2021-12-03 LAB — HIGH SENSITIVITY TROPONIN-I: hs Troponin-I: 2.8 ng/L

## 2021-12-03 LAB — GFR: EGFR: >60

## 2021-12-03 MED ORDER — SODIUM CHLORIDE 0.9 % IV BOLUS
1000.0000 mL | Freq: Once | INTRAVENOUS | Status: AC
Start: 2021-12-03 — End: 2021-12-03
  Administered 2021-12-03: 21:00:00 1000 mL via INTRAVENOUS

## 2021-12-03 NOTE — ED Triage Notes (Signed)
Pt BIBA from [facility]. Pt stated she has weakness/dizziness for the last 45 minutes upon standing. Patient stated she has been having frequent syncopal episodes for the last 3 months. Pt recently had tooth extraction, just completed abx. Cough noted in triage, pt stated she has had a cold for the last 3 days.      BP (!) 164/108    Pulse 83    Temp 98.4 F (36.9 C) (Oral)    Resp 18    Ht 5\' 4"  (1.626 m)    Wt 107 kg    SpO2 97%    BMI 40.51 kg/m

## 2021-12-03 NOTE — EDIE (Signed)
COLLECTIVE?NOTIFICATION?12/03/2021 19:28?DYAMOND, TOLOSA L?MRN: [id number]    Criteria Met      5 ED Visits in 12 Months    Security and Safety  No Security Events were found.  ED Care Guidelines  There are currently no ED Care Guidelines for this patient. Please check your facility's medical records system.    Flags      Negative COVID-19 Lab Result - [facility] - A specimen collected from this patient was negative for COVID-19 / Attributed By: [place] Department [facility] / Attributed On: 11/01/2021       Prescription Monitoring Program  000??- Narcotic Use Score  000??- Sedative Use Score  000??- Stimulant Use Score  000??- Overdose Risk Score  - All Scores range from 000-999 with 75% of the population scoring < 200 and on 1% scoring above 650  - The last digit of the narcotic, sedative, and stimulant score indicates the number of active prescriptions of that type  - Higher Use scores correlate with increased prescribers, pharmacies, mg equiv, and overlapping prescriptions  - Higher Overdose Risk Scores correlate with increased risk of unintentional overdose death   Concerning or unexpectedly high scores should prompt a review of the PMP record; this does not constitute checking PMP for prescribing purposes.    E.D. Visit Count (12 mo.)  Facility Visits   [place] - [facility] 2   [facility] 6   Total 8   Note: Visits indicate total known visits.     Recent Emergency Department Visit Summary  Date Facility [facility] Type Diagnoses or Chief Complaint    Dec 03, 2021  [place] - [facility]  [name]  [place]  Emergency      Weakness/Dizziness      Nov 28, 2021  [place] - [facility]  [name]  [place]  Emergency      infections, vomiting blood, flu symptoms      Emesis      Generalized Body Aches      Viral infection, unspecified      Nov 25, 2021  [place] - [facility]  [name]  [place]  Emergency      body pain      Emesis      Dental Pain      Acute pharyngitis, unspecified      Other specified noninflammatory  disorders of vagina      Periapical abscess without sinus      Nov 09, 2021  [place] - [facility]  [name]  [place]  Emergency      Massive body pain      Rash      Back Pain      Tinea cruris      Low back pain, unspecified      Other chronic pain      Nov 04, 2021  [place] - [facility]  [name]  [place]  Emergency      Refill on Meds/Back Pain      Dizziness      Hemoptysis      Fall      Back Pain      Other chronic pain      Encounter for issue of repeat prescription      Low back pain, unspecified      Nov 01, 2021   - [facility]  [name]  [place]  Emergency      general weakness      Fatigue      Other abnormalities  of gait and mobility      Oct 30, 2021  [place] - [facility]  [name]  [place]  Emergency      medical eval      Generalized Body Aches      History of falling      Unspecified injury of head, initial encounter      Oct 29, 2021  [place] - [facility]  [name]  [place]  Emergency      Stress      Social Issues      Low back pain, unspecified      Encounter for screening, unspecified      Other chronic pain      Housing instability, housed, homelessness in past 12 months        Recent Inpatient Visit Summary  No Recent Inpatient Visits were found.  Care Team  No Care Team was found.  Collective Portal  This patient has registered at the [facility] Emergency Department   For more information visit: [email or web address]     PLEASE NOTE:     1.   Any care recommendations and other clinical information are provided as guidelines or for historical purposes only, and providers should exercise their own clinical judgment when providing care.    2.   You may only use this information for purposes of treatment, payment or health care operations activities, and subject to the limitations of applicable Collective Policies.    3.   You should consult directly with the organization that provided a care guideline or other clinical history with any  questions about additional information or accuracy or completeness of information provided.    ? 2022 [place], [employer] - [email or web address]

## 2021-12-03 NOTE — Discharge Instructions (Addendum)
Please follow-up with your primary care physician within 1 to 2 days as discussed.  If we discussed follow-up with specialists please do so in the discussed timeframe.  Please return to the ED should her symptoms worsen, you develop new symptoms, or symptoms persist without adequate follow-up.

## 2021-12-03 NOTE — ED Provider Notes (Signed)
Chief Complaint   Patient presents with    Dizziness       History of Present Illness:  [name] is a 60 y.o. female with a past medical history of depression, mood disorder, balance problem, homelessness presents ED for evaluation of multiple symptoms including cough, congestion, dizziness, "hot and cold" sensation.  Patient states symptomatology has been present chronically mostly for the past 2 to 3 months.  Of note patient had recent dental extraction has been on amoxicillin recently.  Patient states that she has had frequent near falls with near syncopal episodes as well for the past 3 months.  Endorses cough and cold symptoms for the past 3 days.  Patient has been COVID and flu swab which was unremarkable.  Patient comes from ambulance brought from [facility].  Patient states she worsened today with increased generalized weakness and dizziness that lasted 45 minutes upon standing.  Patient states sitting in a still position is reassuring and she does not have symptoms.  Denies recent trauma or fall.  Denies headache.  Patient takes no medications.  Patient also might request to check her sugar to see if she is diabetic.      Patient denies fever, chills, lightheadedness, headache, loss of appetite, vision changes, sore throat, chest pain, shortness of breath, abdominal pain, dysuria, hematuria, increased or decreased urinary frequency or urgency, nausea, vomiting, diarrhea, constipation, hematochezia, melena, skin changes/rashes, new back pain, new neck pain, joint pain generalized or focal weakness, numbness, tingling, mood changes.    PAST MEDICAL HISTORY: Depression, mood disorder, balance disorder    Past Surgical History:   Procedure Laterality Date    LEG SURGERY Left     Plate placed lower extremmity.       No Known Allergies    Home Medications               acetaminophen (TYLENOL) 500 MG tablet     Take 1 tablet (500 mg) by mouth every 4 (four) hours as needed for Pain     clotrimazole  (LOTRIMIN) 1 % cream     Apply topically 2 (two) times daily     dicyclomine (BENTYL) 10 MG capsule     Take 1 capsule (10 mg) by mouth every 6 (six) hours as needed (abdominal pain)     ibuprofen (ADVIL) 600 MG tablet     Take 1 tablet (600 mg) by mouth every 6 (six) hours as needed for Pain or Fever     lidocaine (LIDODERM) 5 %     Place 1 patch onto the skin every 24 hours Remove & Discard patch within 12 hours or as directed by MD     Magic Mouthwash (w/o nystatin)     Take 15 mLs by mouth 4 (four) times daily     naproxen (NAPROSYN) 250 MG tablet     Take 1.5 tablets (375 mg) by mouth 2 (two) times daily with meals     nystatin (MYCOSTATIN) cream (Expired)     Apply topically 2 (two) times daily for 7 days Apply to affected area 3 to 4 times a day for 7 days     ondansetron (ZOFRAN-ODT) 4 MG disintegrating tablet     Take 1 tablet (4 mg) by mouth every 6 (six) hours as needed for Nausea      Ongoing Comment    [name], RN    07/07/2020  7:24 PM     Takes no meds.  No family history on file.    SOCIAL HISTORY: Denies smoking history, denies alcohol use, denies illicit drug use.  Denies marijuana use.  Social History     Tobacco Use    Smoking status: Never    Smokeless tobacco: Never    Tobacco comments:     no use of cigarettes in the past 30 days    Substance Use Topics    Alcohol use: Yes     Comment: celebratory, No alcohol use in the past 12 months        REVIEW OF SYSTEMS:  Otherwise as per HPI.  All other systems reviewed and negative unless otherwise noted above.    PHYSICAL EXAM:    ED Triage Vitals   Enc Vitals Group      BP 12/03/21 1934 (!) 164/108      Heart Rate 12/03/21 1934 83      Resp Rate 12/03/21 1934 18      Temp 12/03/21 1934 98.4 F (36.9 C)      Temp Source 12/03/21 1934 Oral      SpO2 12/03/21 1934 97 %      Weight 12/03/21 1931 107 kg      Height 12/03/21 1931 1.626 m      Head Circumference --       Peak Flow --       Pain Score --       Pain Loc --       Pain Edu?  --       Excl. in GC? --        CONSTITUTIONAL:  Well appearing, no apparent distress.   EYES: Pupils are equally round and reactive to light. There is no evidence of conjunctival pallor. There is no evidence of conjunctival injection.  HENT: Normocephalic, atraumatic head. Mucous membranes moist.  Oropharynx unremarkable. There is no evidence of cervical lymphadenopathy. No evidence of meningismus.  CARDIOVASCULAR: Heart is regular, no murmurs, rubs, or gallops.  Good peripheral pulses. Cap refill less than 2 seconds.  PULMONARY/CHEST: Lungs are clear to auscultation bilaterally.  No signs of respiratory distress. No wheezing, rhonchi, or rales noted bilaterally.  ABDOMINAL: Soft, nontender, nondistended. No guarding or rebound, No obvious masses or organomegaly. Normal bowel sounds in all 4 quadrants.   MUSCULOSKELETAL: No deformities. No cyanosis. No pedal edema. No flank tenderness noted bilaterally.  No midline spinal tenderness noted.  NEURO: The patient is AAOx3.  There are no focal neurologic deficits.    SKIN: Warm, well perfused.  No acute rashes.  PSYCH:  Normal affect. Normal speech. No hallucinations    ED STUDIES:     Labs Reviewed   CBC AND DIFFERENTIAL - Abnormal; Notable for the following components:       Result Value    MCV 96.7 (*)     Lymphocytes Absolute Automated 3.45 (*)     All other components within normal limits   COMPREHENSIVE METABOLIC PANEL - Abnormal; Notable for the following components:    Glucose 231 (*)     All other components within normal limits   COVID-19 (SARS-COV-2) & INFLUENZA  A/B, NAA (ROCHE LIAT)    Narrative:     o Collect and clearly label specimen type:  o PREFERRED-Upper respiratory specimen: One Nasal Swab in  Transport Media.  o Hand deliver to laboratory ASAP  Diagnostic -PUI   HIGH SENSITIVITY TROPONIN-I   GFR   URINALYSIS REFLEX TO MICROSCOPIC EXAM - REFLEX TO CULTURE  XR Chest PA Or AP   Final Result      Low lung volumes. Otherwise, no acute  cardiopulmonary findings.       [name]    12/03/2021 8:45 PM          ED COURSE:    Vitals:    12/03/21 1931 12/03/21 1934 12/03/21 2300   BP:  (!) 164/108 139/84   Pulse:  83 68   Resp:  18 18   Temp:  98.4 F (36.9 C) 98 F (36.7 C)   TempSrc:  Oral Oral   SpO2:  97% 99%   Weight: 107 kg     Height: 5\' 4"  (1.626 m)         Medications   sodium chloride 0.9 % bolus 1,000 mL (0 mLs Intravenous Stopped 12/03/21 2259)            I am the first provider for this patient.    I reviewed the vital signs, available nursing notes, past medical history, past surgical history, family history, social history, old medical records, outside records.      Provider Notes/MDM:     In summary, SOILA PRINTUP is a 60 y.o. female with the above past medical history who presents to ED for evaluation of cough, congestion, dizziness, generalized weakness, hot and cold sensation.  On initial evaluation, the patient was noted to be non toxic appearing and in no acute distress.  Initial vitals noted to be stable.  Clinic well-appearing 60 year old female with history of homelessness presents for repair for chronic appearing issues.  Patient had worsening of symptoms today with dizziness and ambulatory dysfunction.  On evaluation patient is clinically well-appearing with no focal findings on exam.  Patient neurovascularly intact and stands without difficulty.  Patient had recent dental extraction.  No signs of infectious process.  Laboratory evaluation shows hyperglycemia without evidence of infectious process.  Patient given IV fluids and p.o. challenge without difficulty.  Patient ambulated without difficulty as well.  Extensive discussion with patient was had regarding her symptomatology 2.  Given elevated blood pressure reading of 164/108 with hyperglycemia and no outpatient follow-up the patient will be recommended for outpatient follow-up.  Patient was provided Endo of physician list to call to establish outpatient care.   Patient demonstrated full understanding.  Patient to continue antibiotics as well.    Based on patient's clinical presentation, stable vitals, and reassuring evaluation, I did feel the patient can be safely discharged and have close follow-up with patient's PCP within the next few days and specialists if discussed at the time of disposition.  Patient was given ample time to ask questions and all questions were answered at this time to patient satisfaction.  I did give the patient strict return precautions to return to the ED should symptoms persist, worsen, or develop new symptoms that are concerning for evaluation.  Patient discharged in hemodynamically stable condition.    DIAGNOSTIC IMPRESSION:  1. Hyperglycemia    2. Dizziness    3. Upper respiratory tract infection, unspecified type        DISPOSITION: Discharge    MDM  Risk of Complications, Morbidity, and/or Mortality  Presenting problems: low  Diagnostic procedures: low  Management options: low        **Please note, this document was generated using voice recognition software which was designed to generate medical notes and help improve efficiency in patient care.  Unfortunately, the software does generate grammatical errors and typos.  While this note  was reviewed and edited where appropriate, its possible that some of these errors may still be present.       ReddenBrett Canales, MD  12/03/21 [phone]

## 2021-12-04 LAB — ECG 12-LEAD
Atrial Rate: 77 {beats}/min
IHS MUSE NARRATIVE AND IMPRESSION: NORMAL
P Axis: 78 degrees
P-R Interval: 128 ms
Q-T Interval: 358 ms
QRS Duration: 70 ms
QTC Calculation (Bezet): 405 ms
R Axis: 49 degrees
T Axis: 65 degrees
Ventricular Rate: 77 {beats}/min
